# Patient Record
Sex: Female | Born: 1965 | Race: Black or African American | Hispanic: Yes | Marital: Married | Smoking: Never smoker
Health system: Southern US, Community
[De-identification: ages and names within clinical notes are randomized; demographics above are authoritative.]

## PROBLEM LIST (undated history)

## (undated) DIAGNOSIS — F419 Anxiety disorder, unspecified: Secondary | ICD-10-CM

## (undated) HISTORY — PX: LIPOSUCTION: SHX10

---

## 2016-09-19 ENCOUNTER — Encounter (HOSPITAL_BASED_OUTPATIENT_CLINIC_OR_DEPARTMENT_OTHER): Payer: Self-pay | Admitting: Emergency Medicine

## 2016-09-19 ENCOUNTER — Inpatient Hospital Stay (HOSPITAL_BASED_OUTPATIENT_CLINIC_OR_DEPARTMENT_OTHER)
Admission: EM | Admit: 2016-09-19 | Discharge: 2016-09-21 | DRG: 419 | Disposition: A | Discharge status: Home / Self Care | Payer: PRIVATE HEALTH INSURANCE | Attending: Surgery | Admitting: Surgery

## 2016-09-19 ENCOUNTER — Emergency Department (HOSPITAL_BASED_OUTPATIENT_CLINIC_OR_DEPARTMENT_OTHER): Payer: PRIVATE HEALTH INSURANCE

## 2016-09-19 DIAGNOSIS — K8012 Calculus of gallbladder with acute and chronic cholecystitis without obstruction: Principal | ICD-10-CM | POA: Diagnosis present

## 2016-09-19 DIAGNOSIS — L299 Pruritus, unspecified: Secondary | ICD-10-CM | POA: Diagnosis not present

## 2016-09-19 DIAGNOSIS — K760 Fatty (change of) liver, not elsewhere classified: Secondary | ICD-10-CM | POA: Diagnosis present

## 2016-09-19 DIAGNOSIS — R0902 Hypoxemia: Secondary | ICD-10-CM

## 2016-09-19 DIAGNOSIS — M542 Cervicalgia: Secondary | ICD-10-CM | POA: Diagnosis not present

## 2016-09-19 DIAGNOSIS — F419 Anxiety disorder, unspecified: Secondary | ICD-10-CM

## 2016-09-19 DIAGNOSIS — K1120 Sialoadenitis, unspecified: Secondary | ICD-10-CM | POA: Diagnosis not present

## 2016-09-19 DIAGNOSIS — G8929 Other chronic pain: Secondary | ICD-10-CM | POA: Diagnosis present

## 2016-09-19 DIAGNOSIS — K812 Acute cholecystitis with chronic cholecystitis: Secondary | ICD-10-CM | POA: Diagnosis present

## 2016-09-19 DIAGNOSIS — Z419 Encounter for procedure for purposes other than remedying health state, unspecified: Secondary | ICD-10-CM

## 2016-09-19 DIAGNOSIS — M545 Low back pain, unspecified: Secondary | ICD-10-CM | POA: Diagnosis present

## 2016-09-19 DIAGNOSIS — K1121 Acute sialoadenitis: Secondary | ICD-10-CM | POA: Diagnosis present

## 2016-09-19 DIAGNOSIS — Z6838 Body mass index (BMI) 38.0-38.9, adult: Secondary | ICD-10-CM

## 2016-09-19 DIAGNOSIS — R1013 Epigastric pain: Secondary | ICD-10-CM | POA: Diagnosis not present

## 2016-09-19 DIAGNOSIS — K429 Umbilical hernia without obstruction or gangrene: Secondary | ICD-10-CM | POA: Diagnosis present

## 2016-09-19 DIAGNOSIS — E669 Obesity, unspecified: Secondary | ICD-10-CM | POA: Diagnosis present

## 2016-09-19 DIAGNOSIS — E876 Hypokalemia: Secondary | ICD-10-CM | POA: Diagnosis present

## 2016-09-19 DIAGNOSIS — K810 Acute cholecystitis: Secondary | ICD-10-CM

## 2016-09-19 HISTORY — DX: Anxiety disorder, unspecified: F41.9

## 2016-09-19 LAB — URINALYSIS, ROUTINE W REFLEX MICROSCOPIC
Glucose, UA: NEGATIVE mg/dL
Hgb urine dipstick: NEGATIVE
KETONES UR: NEGATIVE mg/dL
LEUKOCYTES UA: NEGATIVE
NITRITE: NEGATIVE
PH: 7 (ref 5.0–8.0)
PROTEIN: NEGATIVE mg/dL
Specific Gravity, Urine: 1.008 (ref 1.005–1.030)

## 2016-09-19 LAB — COMPREHENSIVE METABOLIC PANEL
ALBUMIN: 3.7 g/dL (ref 3.5–5.0)
ALT: 36 U/L (ref 14–54)
ANION GAP: 7 (ref 5–15)
AST: 29 U/L (ref 15–41)
Alkaline Phosphatase: 76 U/L (ref 38–126)
BILIRUBIN TOTAL: 0.4 mg/dL (ref 0.3–1.2)
BUN: 14 mg/dL (ref 6–20)
CO2: 31 mmol/L (ref 22–32)
Calcium: 9.9 mg/dL (ref 8.9–10.3)
Chloride: 99 mmol/L — ABNORMAL LOW (ref 101–111)
Creatinine, Ser: 1.04 mg/dL — ABNORMAL HIGH (ref 0.44–1.00)
GFR calc non Af Amer: >60 mL/min (ref >60)
GLUCOSE: 120 mg/dL — AB (ref 65–99)
POTASSIUM: 2.9 mmol/L — AB (ref 3.5–5.1)
SODIUM: 137 mmol/L (ref 135–145)
TOTAL PROTEIN: 6.8 g/dL (ref 6.5–8.1)

## 2016-09-19 LAB — CBC WITH DIFFERENTIAL/PLATELET
BASOS PCT: 0 %
Basophils Absolute: 0 10*3/uL (ref 0.0–0.1)
EOS ABS: 0.2 10*3/uL (ref 0.0–0.7)
Eosinophils Relative: 3 %
HCT: 35.3 % — ABNORMAL LOW (ref 36.0–46.0)
Hemoglobin: 11.7 g/dL — ABNORMAL LOW (ref 12.0–15.0)
Lymphocytes Relative: 23 %
Lymphs Abs: 1.9 10*3/uL (ref 0.7–4.0)
MCH: 30.5 pg (ref 26.0–34.0)
MCHC: 33.1 g/dL (ref 30.0–36.0)
MCV: 91.9 fL (ref 78.0–100.0)
MONO ABS: 0.4 10*3/uL (ref 0.1–1.0)
MONOS PCT: 5 %
Neutro Abs: 5.6 10*3/uL (ref 1.7–7.7)
Neutrophils Relative %: 69 %
Platelets: 266 10*3/uL (ref 150–400)
RBC: 3.84 MIL/uL — ABNORMAL LOW (ref 3.87–5.11)
RDW: 12.2 % (ref 11.5–15.5)
WBC: 8.1 10*3/uL (ref 4.0–10.5)

## 2016-09-19 LAB — LIPASE, BLOOD: Lipase: 29 U/L (ref 11–51)

## 2016-09-19 LAB — MAGNESIUM: MAGNESIUM: 1.9 mg/dL (ref 1.7–2.4)

## 2016-09-19 MED ORDER — DEXTROSE 5 % IV SOLN
2.0000 g | INTRAVENOUS | Status: DC
  Administered 2016-09-19: 2 g via INTRAVENOUS
  Filled 2016-09-19: qty 2

## 2016-09-19 MED ORDER — LACTATED RINGERS IV BOLUS (SEPSIS): 1000.0000 mL | Freq: Three times a day (TID) | INTRAVENOUS | Status: DC | PRN

## 2016-09-19 MED ORDER — CHLORHEXIDINE GLUCONATE CLOTH 2 % EX PADS
6.0000 | MEDICATED_PAD | Freq: Once | CUTANEOUS | Status: AC
  Administered 2016-09-20: 6 via TOPICAL
  Filled 2016-09-19: qty 6

## 2016-09-19 MED ORDER — POTASSIUM CHLORIDE 10 MEQ/100ML IV SOLN
10.0000 meq | INTRAVENOUS | Status: DC
  Filled 2016-09-19 (×2): qty 100

## 2016-09-19 MED ORDER — METHOCARBAMOL 1000 MG/10ML IJ SOLN
1000.0000 mg | Freq: Four times a day (QID) | INTRAVENOUS | Status: DC | PRN
  Filled 2016-09-19: qty 10

## 2016-09-19 MED ORDER — LIP MEDEX EX OINT
1.0000 "application " | TOPICAL_OINTMENT | Freq: Two times a day (BID) | CUTANEOUS | Status: DC
  Administered 2016-09-20 – 2016-09-21 (×3): 1 via TOPICAL
  Filled 2016-09-19 (×2): qty 7

## 2016-09-19 MED ORDER — SACCHAROMYCES BOULARDII 250 MG PO CAPS
250.0000 mg | ORAL_CAPSULE | Freq: Two times a day (BID) | ORAL | Status: DC
  Administered 2016-09-20 – 2016-09-21 (×2): 250 mg via ORAL
  Filled 2016-09-19 (×2): qty 1

## 2016-09-19 MED ORDER — ONDANSETRON HCL 4 MG/2ML IJ SOLN
4.0000 mg | Freq: Four times a day (QID) | INTRAMUSCULAR | Status: DC | PRN
  Administered 2016-09-19: 4 mg via INTRAVENOUS
  Filled 2016-09-19: qty 2

## 2016-09-19 MED ORDER — POLYETHYLENE GLYCOL 3350 17 G PO PACK
17.0000 g | PACK | Freq: Two times a day (BID) | ORAL | Status: DC
  Administered 2016-09-20 – 2016-09-21 (×2): 17 g via ORAL
  Filled 2016-09-19 (×2): qty 1

## 2016-09-19 MED ORDER — CHLORHEXIDINE GLUCONATE CLOTH 2 % EX PADS
6.0000 | MEDICATED_PAD | Freq: Once | CUTANEOUS | Status: AC
  Administered 2016-09-19: 6 via TOPICAL
  Filled 2016-09-19: qty 6

## 2016-09-19 MED ORDER — CELECOXIB 200 MG PO CAPS
400.0000 mg | ORAL_CAPSULE | ORAL | Status: AC
  Administered 2016-09-20: 400 mg via ORAL
  Filled 2016-09-19: qty 1
  Filled 2016-09-19: qty 2

## 2016-09-19 MED ORDER — ACETAMINOPHEN 325 MG PO TABS: 325.0000 mg | ORAL_TABLET | Freq: Four times a day (QID) | ORAL | Status: DC | PRN

## 2016-09-19 MED ORDER — MENTHOL 3 MG MT LOZG
1.0000 | LOZENGE | OROMUCOSAL | Status: DC | PRN
  Filled 2016-09-19: qty 9

## 2016-09-19 MED ORDER — GABAPENTIN 300 MG PO CAPS
300.0000 mg | ORAL_CAPSULE | ORAL | Status: AC
  Administered 2016-09-20: 300 mg via ORAL
  Filled 2016-09-19: qty 1

## 2016-09-19 MED ORDER — PHENOL 1.4 % MT LIQD
2.0000 | OROMUCOSAL | Status: DC | PRN
  Filled 2016-09-19: qty 177

## 2016-09-19 MED ORDER — PROCHLORPERAZINE EDISYLATE 5 MG/ML IJ SOLN: 5.0000 mg | INTRAMUSCULAR | Status: DC | PRN

## 2016-09-19 MED ORDER — MAGIC MOUTHWASH
15.0000 mL | Freq: Four times a day (QID) | ORAL | Status: DC | PRN
  Filled 2016-09-19: qty 15

## 2016-09-19 MED ORDER — POTASSIUM CHLORIDE CRYS ER 20 MEQ PO TBCR
40.0000 meq | EXTENDED_RELEASE_TABLET | Freq: Once | ORAL | Status: AC
  Administered 2016-09-19: 40 meq via ORAL
  Filled 2016-09-19: qty 2

## 2016-09-19 MED ORDER — ACETAMINOPHEN 500 MG PO TABS
1000.0000 mg | ORAL_TABLET | ORAL | Status: AC
  Administered 2016-09-20: 1000 mg via ORAL

## 2016-09-19 MED ORDER — HYDROMORPHONE HCL 1 MG/ML IJ SOLN
0.5000 mg | INTRAMUSCULAR | Status: DC | PRN
  Administered 2016-09-19 – 2016-09-20 (×3): 1 mg via INTRAVENOUS
  Filled 2016-09-19 (×2): qty 1

## 2016-09-19 MED ORDER — POTASSIUM CHLORIDE 10 MEQ/100ML IV SOLN
10.0000 meq | INTRAVENOUS | Status: AC
  Administered 2016-09-19: 10 meq via INTRAVENOUS
  Filled 2016-09-19: qty 100

## 2016-09-19 MED ORDER — SODIUM CHLORIDE 0.9 % IV SOLN
30.0000 meq | Freq: Once | INTRAVENOUS | Status: AC
  Administered 2016-09-20: 30 meq via INTRAVENOUS
  Filled 2016-09-19: qty 15

## 2016-09-19 MED ORDER — LACTATED RINGERS IV BOLUS (SEPSIS)
1000.0000 mL | Freq: Once | INTRAVENOUS | Status: AC
  Administered 2016-09-20: 1000 mL via INTRAVENOUS

## 2016-09-19 MED ORDER — DIPHENHYDRAMINE HCL 50 MG/ML IJ SOLN: 12.5000 mg | Freq: Four times a day (QID) | INTRAMUSCULAR | Status: DC | PRN

## 2016-09-19 MED ORDER — BISACODYL 10 MG RE SUPP: 10.0000 mg | Freq: Two times a day (BID) | RECTAL | Status: DC | PRN

## 2016-09-19 MED ORDER — METHOCARBAMOL 500 MG PO TABS: 1000.0000 mg | ORAL_TABLET | Freq: Four times a day (QID) | ORAL | Status: DC | PRN

## 2016-09-19 MED ORDER — METOPROLOL TARTRATE 5 MG/5ML IV SOLN: 5.0000 mg | Freq: Four times a day (QID) | INTRAVENOUS | Status: DC | PRN

## 2016-09-19 MED ORDER — LACTATED RINGERS IV BOLUS (SEPSIS)
1000.0000 mL | Freq: Once | INTRAVENOUS | Status: AC
  Administered 2016-09-19: 1000 mL via INTRAVENOUS

## 2016-09-19 MED ORDER — METOCLOPRAMIDE HCL 5 MG/ML IJ SOLN: 5.0000 mg | Freq: Four times a day (QID) | INTRAMUSCULAR | Status: DC | PRN

## 2016-09-19 MED ORDER — OXYCODONE HCL 5 MG PO TABS
5.0000 mg | ORAL_TABLET | ORAL | Status: DC | PRN
  Administered 2016-09-20 (×2): 5 mg via ORAL
  Administered 2016-09-21: 10 mg via ORAL
  Filled 2016-09-19: qty 2
  Filled 2016-09-19 (×2): qty 1

## 2016-09-19 MED ORDER — SODIUM CHLORIDE 0.9 % IV SOLN
8.0000 mg | Freq: Four times a day (QID) | INTRAVENOUS | Status: DC | PRN
  Filled 2016-09-19: qty 4

## 2016-09-19 MED ORDER — ALUM & MAG HYDROXIDE-SIMETH 200-200-20 MG/5ML PO SUSP: 30.0000 mL | Freq: Four times a day (QID) | ORAL | Status: DC | PRN

## 2016-09-19 MED ORDER — IBUPROFEN 400 MG PO TABS
400.0000 mg | ORAL_TABLET | Freq: Four times a day (QID) | ORAL | Status: DC | PRN
  Filled 2016-09-19: qty 2

## 2016-09-19 MED ORDER — TRAMADOL HCL 50 MG PO TABS: 50.0000 mg | ORAL_TABLET | Freq: Four times a day (QID) | ORAL | Status: DC | PRN

## 2016-09-19 MED ORDER — LACTATED RINGERS IV SOLN
INTRAVENOUS | Status: DC
Start: 2016-09-19 — End: 2016-09-21
  Administered 2016-09-20: 10:00:00 via INTRAVENOUS
  Administered 2016-09-20: 1000 mL via INTRAVENOUS

## 2016-09-19 MED ORDER — ACETAMINOPHEN 650 MG RE SUPP: 650.0000 mg | Freq: Four times a day (QID) | RECTAL | Status: DC | PRN

## 2016-09-19 NOTE — ED Provider Notes (Signed)
[facility] Provider Note   CSN: [id number] Arrival date & time: 09/19/16  1738  By signing my name below, I, Sonum Patel, attest that this documentation has been prepared under the direction and in the presence of [employer]. Electronically Signed: Sonum Patel, Neurosurgeon. 09/19/16. 6:12 PM.  History   Chief Complaint Chief Complaint  Patient presents with  . Abdominal Pain    The history is provided by the patient. No language interpreter was used.     HPI Comments: Rebecca Tyler is a 50 y.o. female who presents to the Emergency Department complaining of constant, unchanged abdominal pain that began yesterday. She describes it as sharp to the periumbilical area and as throbbing/cramping to the epigastric region. She states her abdominal pain is worse with having a BM. She states flatulence does not ease her pain. She has tried Tums, Mylanta, Gas-X without relief. She states the pain is unchanged with eating or drinking. She regularly takes anti-inflammatories (Etodolac) but states they have not caused her any abdominal issues in the past. She denies alcohol use. She reports history of liposuction to the abdomen about 3 years ago; denies history of hernias, appendectomy, or cholecystectomy. She denies diarrhea, constipation, hematuria, blood in stool.   Past Medical History:  Diagnosis Date  . Anxiety     There are no active problems to display for this patient.   History reviewed. No pertinent surgical history.  OB History    No data available       Home Medications    Prior to Admission medications   Medication Sig Start Date End Date Taking? Authorizing Provider  atenolol-chlorthalidone (TENORETIC) 50-25 MG tablet Take 1 tablet by mouth daily.   Yes Historical Provider, MD  buPROPion (WELLBUTRIN SR) 150 MG 12 hr tablet Take 150 mg by mouth 2 (two) times daily.   Yes Historical Provider, MD  buPROPion (WELLBUTRIN XL) 300 MG 24 hr tablet Take 300 mg by mouth  daily.   Yes Historical Provider, MD  citalopram (CELEXA) 20 MG tablet Take 40 mg by mouth daily.   Yes Historical Provider, MD  etodolac (LODINE) 500 MG tablet Take 500 mg by mouth 2 (two) times daily.   Yes Historical Provider, MD    Family History History reviewed. No pertinent family history.  Social History Social History  Substance Use Topics  . Smoking status: Never Smoker  . Smokeless tobacco: Never Used  . Alcohol use No     Allergies   Sulfa antibiotics   Review of Systems Review of Systems  Constitutional: Negative.  Negative for fever.  HENT: Negative.  Negative for rhinorrhea and sore throat.   Eyes: Negative for redness.  Respiratory: Negative.  Negative for cough.   Cardiovascular: Negative.  Negative for chest pain.  Gastrointestinal: Positive for abdominal pain. Negative for constipation, diarrhea, nausea and vomiting.  Genitourinary: Negative.  Negative for dysuria and hematuria.  Musculoskeletal: Negative.  Negative for myalgias.  Skin: Negative.  Negative for rash.  Neurological: Negative.  Negative for headaches.  Psychiatric/Behavioral: Negative.      Physical Exam Updated Vital Signs BP 124/75 (BP Location: Right Arm)   Pulse 75   Temp 98.4 F (36.9 C) (Oral)   Resp 20   Ht 5\' 6"  (1.676 m)   Wt 240 lb (108.9 kg)   SpO2 100%   BMI 38.74 kg/m   Physical Exam  Constitutional: She is oriented to person, place, and time. She appears well-developed and well-nourished.  HENT:  Head:  Normocephalic and atraumatic.  Eyes: Conjunctivae are normal. Right eye exhibits no discharge. Left eye exhibits no discharge.  Neck: Normal range of motion. Neck supple.  Cardiovascular: Normal rate, regular rhythm and normal heart sounds.   Pulmonary/Chest: Effort normal and breath sounds normal.  Abdominal: Soft. Bowel sounds are normal. She exhibits no distension and no mass. There is tenderness. There is guarding.    Neurological: She is alert and oriented  to person, place, and time.  Skin: Skin is warm and dry.  Psychiatric: She has a normal mood and affect.  Nursing note and vitals reviewed.  ED Treatments / Results  DIAGNOSTIC STUDIES: Oxygen Saturation is 100% on RA, normal by my interpretation.    COORDINATION OF CARE: 6:12 PM Will order lab work and US. Discussed treatment plan with pt at bedside and pt agreed to plan.    Labs (all labs ordered are listed, but only abnormal results are displayed) Labs Reviewed  CBC WITH DIFFERENTIAL/PLATELET - Abnormal; Notable for the following:       Result Value   RBC 3.84 (*)    Hemoglobin 11.7 (*)    HCT 35.3 (*)    All other components within normal limits  COMPREHENSIVE METABOLIC PANEL - Abnormal; Notable for the following:    Potassium 2.9 (*)    Chloride 99 (*)    Glucose, Bld 120 (*)    Creatinine, Ser 1.04 (*)    All other components within normal limits  URINALYSIS, ROUTINE W REFLEX MICROSCOPIC (NOT AT [facility]) - Abnormal; Notable for the following:    Bilirubin Urine MODERATE (*)    All other components within normal limits  LIPASE, BLOOD  MAGNESIUM     Radiology [place] Abdomen Complete  Result Date: 09/19/2016 CLINICAL DATA:  Periumbilical and epigastric pain for 1 day. EXAM: ABDOMEN ULTRASOUND COMPLETE COMPARISON:  None. FINDINGS: Gallbladder: Cholelithiasis with over distended gallbladder that is focally tender, thick walled (5 mm), and shows pericholecystic edema. Common bile duct: Diameter: 3 mm Liver: Diffusely echogenic. No evidence of mass. Antegrade flow in the imaged hepatic and portal venous system. IVC: No abnormality visualized. Pancreas: Visualized portion unremarkable. Spleen: Size and appearance within normal limits. Right Kidney: Length: 11.5 cm. Echogenicity within normal limits. No mass or hydronephrosis visualized. Left Kidney: Length: 11 cm. Echogenicity within normal limits. No mass or hydronephrosis visualized. Abdominal aorta: No aneurysm visualized.  IMPRESSION: 1. Positive for cholelithiasis and signs of acute cholecystitis. 2. Hepatic steatosis. Electronically Signed   By: Marnee SpringJonathon  Watts M.D.   On: 09/19/2016 19:36    Procedures Procedures (including critical care time)  Medications Ordered in ED Medications  Chlorhexidine Gluconate Cloth 2 % PADS 6 each (not administered)    And  Chlorhexidine Gluconate Cloth 2 % PADS 6 each (not administered)  gabapentin (NEURONTIN) capsule 300 mg (not administered)  acetaminophen (TYLENOL) tablet 1,000 mg (not administered)  celecoxib (CELEBREX) capsule 400 mg (not administered)  lactated ringers infusion (not administered)  lactated ringers bolus 1,000 mL (not administered)  lactated ringers bolus 1,000 mL (not administered)  cefTRIAXone (ROCEPHIN) 2 g in dextrose 5 % 50 mL IVPB (not administered)  HYDROmorphone (DILAUDID) injection 0.5-2 mg (not administered)  traMADol (ULTRAM) tablet 50-100 mg (not administered)  ibuprofen (ADVIL,MOTRIN) tablet 400-800 mg (not administered)  methocarbamol (ROBAXIN) 1,000 mg in dextrose 5 % 50 mL IVPB (not administered)  methocarbamol (ROBAXIN) tablet 1,000 mg (not administered)  lip balm (CARMEX) ointment 1 application (not administered)  phenol (CHLORASEPTIC) mouth spray 2 spray (not administered)  menthol-cetylpyridinium (CEPACOL) lozenge 3 mg (not administered)  magic mouthwash (not administered)  alum & mag hydroxide-simeth (MAALOX/MYLANTA) 200-200-20 MG/5ML suspension 30 mL (not administered)  bisacodyl (DULCOLAX) suppository 10 mg (not administered)  ondansetron (ZOFRAN) injection 4 mg (not administered)    Or  ondansetron (ZOFRAN) 8 mg in sodium chloride 0.9 % 50 mL IVPB (not administered)  prochlorperazine (COMPAZINE) injection 5-10 mg (not administered)  metoCLOPramide (REGLAN) injection 5-10 mg (not administered)  metoprolol (LOPRESSOR) injection 5 mg (not administered)  diphenhydrAMINE (BENADRYL) injection 12.5-25 mg (not administered)    potassium chloride SA (K-DUR,KLOR-CON) CR tablet 40 mEq (40 mEq Oral Given 09/19/16 1923)     Initial Impression / Assessment and Plan / ED Course  I have reviewed the triage vital signs and the nursing notes.  Pertinent labs & imaging results that were available during my care of the patient were reviewed by me and considered in my medical decision making (see chart for details).  Clinical Course    7:49 PM Rechecked patient at this time and updated on imaging results.   8:49 PM examination imaging is concerning for early acute cholecystitis. I spoke with Dr. Michaell CowingGross. He requests that [facility]. I discussed this with the patient. Ideally, she would return to [place] for treatment however after discussion of risks and benefits, probability of worsening, she agrees to be admitted here.  [facility], Spoke with Earley FavorGail Schulz NP who is working with Dr. Madilyn Hookees. They will expect patient and page Dr. Michaell CowingGross on arrival.   Potassium repletion underway.   Final Clinical Impressions(s) / ED Diagnoses   Final diagnoses:  Acute cholecystitis  Hypokalemia   To [facility] for eval, possible acute cholecystitis.   New Prescriptions New Prescriptions   No medications on file   I personally performed the services described in this documentation, which was scribed in my presence. The recorded information has been reviewed and is accurate.    Renne CriglerJoshua Opel Lejeune, PA-C 09/19/16 2051    Laurence Spatesachel Morgan Little, MD 09/24/16 [phone]

## 2016-09-19 NOTE — H&P (Signed)
[facility]  [address] [place], [place] [address] Phone: [phone] FAX: [phone]     Rebecca Tyler  02-16-1966 [id number]  CARE TEAM:  PCP: Pcp Not In System  Outpatient Care Team: Patient Care Team: Pcp Not In System as PCP - General  Inpatient Treatment Team: Treatment Team: Attending Provider: Sharlett Iles, MD; Physician Assistant: Carlisle Cater, PA-C; Registered Nurse: Radene Journey, RN; Consulting Physician: Nolon Nations, MD  This patient is a 50 y.o.female who presents today for surgical evaluation at the request of Dr Linton Ham Geiple, [facility].   Reason for evaluation: Acute cholecystitis  Pleasant obese female traveling for the holidays.  We will come up yesterday with abdominal pain.  Mainly periumbilical and upper abdomen.  Pain to take deep breath.  Took her chronic pain NSAID medication extra that she usually takes for her chronic low back pain.  Seemed to help.  Pain came back though.  No improvement with over-the-counter and acid in anti-gas medications.  Intensified.  Worsened.  Nauseated.  Poor appetite.  Came to the emergency room.  No frank peritonitis but pain in upper abdomen suspicious.  Ultrasound done showing gallbladder wall thickening and pericholecystic fluid with gallstones.  Very suspicious for acute cholecystitis.  [place] was [place] [facility] [place].  Given evidence of cholecystitis and needing IV pain control, recommendation made for surgical evaluation.  Patient transferred to was in the hospital so that could happen.  Husband at bedside.  No personal nor family history of GI/colon cancer, inflammatory bowel disease, irritable bowel syndrome, allergy such as Celiac Sprue, dietary/dairy problems, colitis, ulcers nor gastritis.  No recent sick contacts/gastroenteritis.  No travel outside the country.  No changes in diet.  No dysphagia to solids or liquids.  No significant  heartburn or reflux.  No hematochezia, hematemesis, coffee ground emesis.  No evidence of prior gastric/peptic ulceration.  Normally moves her bowels every day.  No severe constipation or diarrhea.  No is been sick while traveling.  No radical change in diet.   Past Medical History:  Diagnosis Date  . Anxiety     History reviewed. No pertinent surgical history.  Social History   Social History  . Marital status: Married    Spouse name: N/A  . Number of children: N/A  . Years of education: N/A   Occupational History  . Not on file.   Social History Main Topics  . Smoking status: Never Smoker  . Smokeless tobacco: Never Used  . Alcohol use No  . Drug use: No  . Sexual activity: Not on file   Other Topics Concern  . Not on file   Social History Narrative  . No narrative on file    History reviewed. No pertinent family history.  Current Facility-Administered Medications  Medication Dose Route Frequency Provider Last Rate Last Dose  . [START ON 09/20/2016] acetaminophen (TYLENOL) tablet 1,000 mg  1,000 mg Oral On Call to OR Michael Boston, MD      . alum & mag hydroxide-simeth (MAALOX/MYLANTA) 200-200-20 MG/5ML suspension 30 mL  30 mL Oral Q6H PRN Michael Boston, MD      . bisacodyl (DULCOLAX) suppository 10 mg  10 mg Rectal Q12H PRN Michael Boston, MD      . cefTRIAXone (ROCEPHIN) 2 g in dextrose 5 % 50 mL IVPB  2 g Intravenous Q24H Michael Boston, MD      . Derrill Memo ON 09/20/2016] celecoxib (CELEBREX)  capsule 400 mg  400 mg Oral On Call to OR Michael Boston, MD      . Chlorhexidine Gluconate Cloth 2 % PADS 6 each  6 each Topical Once Michael Boston, MD       And  . Chlorhexidine Gluconate Cloth 2 % PADS 6 each  6 each Topical Once Michael Boston, MD      . diphenhydrAMINE (BENADRYL) injection 12.5-25 mg  12.5-25 mg Intravenous Q6H PRN Michael Boston, MD      . Derrill Memo ON 09/20/2016] gabapentin (NEURONTIN) capsule 300 mg  300 mg Oral On Call to OR Michael Boston, MD      . HYDROmorphone  (DILAUDID) injection 0.5-2 mg  0.5-2 mg Intravenous Q1H PRN Michael Boston, MD      . ibuprofen (ADVIL,MOTRIN) tablet 400-800 mg  400-800 mg Oral Q6H PRN Michael Boston, MD      . lactated ringers bolus 1,000 mL  1,000 mL Intravenous Once Michael Boston, MD      . lactated ringers bolus 1,000 mL  1,000 mL Intravenous Q8H PRN Michael Boston, MD      . lactated ringers infusion   Intravenous Continuous Michael Boston, MD      . lip balm (CARMEX) ointment 1 application  1 application Topical BID Michael Boston, MD      . magic mouthwash  15 mL Oral QID PRN Michael Boston, MD      . menthol-cetylpyridinium (CEPACOL) lozenge 3 mg  1 lozenge Oral PRN Michael Boston, MD      . methocarbamol (ROBAXIN) 1,000 mg in dextrose 5 % 50 mL IVPB  1,000 mg Intravenous Q6H PRN Michael Boston, MD      . methocarbamol (ROBAXIN) tablet 1,000 mg  1,000 mg Oral Q6H PRN Michael Boston, MD      . metoCLOPramide (REGLAN) injection 5-10 mg  5-10 mg Intravenous Q6H PRN Michael Boston, MD      . metoprolol (LOPRESSOR) injection 5 mg  5 mg Intravenous Q6H PRN Michael Boston, MD      . ondansetron [facility]) injection 4 mg  4 mg Intravenous Q6H PRN Michael Boston, MD       Or  . ondansetron (ZOFRAN) 8 mg in sodium chloride 0.9 % 50 mL IVPB  8 mg Intravenous Q6H PRN Michael Boston, MD      . phenol (CHLORASEPTIC) mouth spray 2 spray  2 spray Mouth/Throat PRN Michael Boston, MD      . prochlorperazine (COMPAZINE) injection 5-10 mg  5-10 mg Intravenous Q4H PRN Michael Boston, MD      . traMADol Veatrice Bourbon) tablet 50-100 mg  50-100 mg Oral Q6H PRN Michael Boston, MD       Current Outpatient Prescriptions  Medication Sig Dispense Refill  . atenolol-chlorthalidone (TENORETIC) 50-25 MG tablet Take 1 tablet by mouth daily.    Marland Kitchen buPROPion (WELLBUTRIN SR) 150 MG 12 hr tablet Take 150 mg by mouth 2 (two) times daily.    Marland Kitchen buPROPion (WELLBUTRIN XL) 300 MG 24 hr tablet Take 300 mg by mouth daily.    . citalopram (CELEXA) 20 MG tablet Take 40 mg by mouth daily.    Marland Kitchen etodolac  (LODINE) 500 MG tablet Take 500 mg by mouth 2 (two) times daily.       Allergies  Allergen Reactions  . Sulfa Antibiotics Rash    ROS: Constitutional:  No fevers, chills, sweats.  Weight stable Eyes:  No vision changes, No discharge HENT:  No sore throats, nasal drainage Lymph: No neck swelling,  No bruising easily Pulmonary:  No cough, productive sputum CV: No orthopnea, PND  Patient walks 30 minutes for about 1 miles without difficulty.  No exertional chest/neck/shoulder/arm pain. GI: No personal nor family history of GI/colon cancer, inflammatory bowel disease, irritable bowel syndrome, allergy such as Celiac Sprue, dietary/dairy problems, colitis, ulcers nor gastritis.  No recent sick contacts/gastroenteritis.  No travel outside the country.  No changes in diet. Renal: No UTIs, No hematuria Genital:  No drainage, bleeding, masses Musculoskeletal: Chronic LBP.  No severe joint pain.  Good ROM major joints Skin:  No sores or lesions.  No rashes Heme/Lymph:  No easy bleeding.  No swollen lymph nodes Neuro: No focal weakness/numbness.  No seizures Psych: No suicidal ideation.  No hallucinations  BP 124/75 (BP Location: Right Arm)   Pulse 75   Temp 98.4 F (36.9 C) (Oral)   Resp 20   Ht 5' 6"  (1.676 m)   Wt 108.9 kg (240 lb)   SpO2 100%   BMI 38.74 kg/m   Physical Exam: General: Pt awake/alert/oriented x4 in mild major acute distress Eyes: PERRL, normal EOM. Sclera nonicteric Neuro: CN II-XII intact w/o focal sensory/motor deficits. Lymph: No head/neck/groin lymphadenopathy Psych:  No delerium/psychosis/paranoia HENT: Normocephalic, Mucus membranes moist.  No thrush Neck: Supple, No tracheal deviation Chest: No pain.  Good respiratory excursion. CV:  Pulses intact.  Regular rhythm Abdomen: Soft, Obese.  Tenderness in right upper quadrant.  Epigastric region as well.  Umbilicus very sensitive for some referred pain there.  No pain in left side or in suprapubic region.   Nondistended.  No diastases.  No umbilical hernia  Genital: Normal external genitalia.  Known hernia.  No lymphadenopathy. Ext:  SCDs BLE.  No significant edema.  No cyanosis Skin: No petechiae / purpurea.  No major sores Musculoskeletal: No severe joint pain.  Good ROM major joints   Results:   Labs: Results for orders placed or performed during the hospital encounter of 09/19/16 (from the past 48 hour(s))  CBC with Differential/Platelet     Status: Abnormal   Collection Time: 09/19/16  6:23 PM  Result Value Ref Range   WBC 8.1 4.0 - 10.5 K/uL   RBC 3.84 (L) 3.87 - 5.11 MIL/uL   Hemoglobin 11.7 (L) 12.0 - 15.0 g/dL   HCT 35.3 (L) 36.0 - 46.0 %   MCV 91.9 78.0 - 100.0 fL   MCH 30.5 26.0 - 34.0 pg   MCHC 33.1 30.0 - 36.0 g/dL   RDW 12.2 11.5 - 15.5 %   Platelets 266 150 - 400 K/uL   Neutrophils Relative % 69 %   Neutro Abs 5.6 1.7 - 7.7 K/uL   Lymphocytes Relative 23 %   Lymphs Abs 1.9 0.7 - 4.0 K/uL   Monocytes Relative 5 %   Monocytes Absolute 0.4 0.1 - 1.0 K/uL   Eosinophils Relative 3 %   Eosinophils Absolute 0.2 0.0 - 0.7 K/uL   Basophils Relative 0 %   Basophils Absolute 0.0 0.0 - 0.1 K/uL  Comprehensive metabolic panel     Status: Abnormal   Collection Time: 09/19/16  6:23 PM  Result Value Ref Range   Sodium 137 135 - 145 mmol/L   Potassium 2.9 (L) 3.5 - 5.1 mmol/L   Chloride 99 (L) 101 - 111 mmol/L   CO2 31 22 - 32 mmol/L   Glucose, Bld 120 (H) 65 - 99 mg/dL   BUN 14 6 - 20 mg/dL   Creatinine, Ser 1.04 (H) 0.44 -  1.00 mg/dL   Calcium 9.9 8.9 - 10.3 mg/dL   Total Protein 6.8 6.5 - 8.1 g/dL   Albumin 3.7 3.5 - 5.0 g/dL   AST 29 15 - 41 U/L   ALT 36 14 - 54 U/L   Alkaline Phosphatase 76 38 - 126 U/L   Total Bilirubin 0.4 0.3 - 1.2 mg/dL   GFR calc non Af Amer >60 >60 mL/min   GFR calc Af Amer >60 >60 mL/min    Comment: (NOTE) The eGFR has been calculated using the CKD EPI equation. This calculation has not been validated in all clinical situations. eGFR's  persistently <60 mL/min signify possible Chronic Kidney Disease.    Anion gap 7 5 - 15  Lipase, blood     Status: None   Collection Time: 09/19/16  6:23 PM  Result Value Ref Range   Lipase 29 11 - 51 U/L  Urinalysis, Routine w reflex microscopic (not at [facility])     Status: Abnormal   Collection Time: 09/19/16  6:23 PM  Result Value Ref Range   Color, Urine YELLOW YELLOW   APPearance CLEAR CLEAR   Specific Gravity, Urine 1.008 1.005 - 1.030   pH 7.0 5.0 - 8.0   Glucose, UA NEGATIVE NEGATIVE mg/dL   Hgb urine dipstick NEGATIVE NEGATIVE   Bilirubin Urine MODERATE (A) NEGATIVE   Ketones, ur NEGATIVE NEGATIVE mg/dL   Protein, ur NEGATIVE NEGATIVE mg/dL   Nitrite NEGATIVE NEGATIVE   Leukocytes, UA NEGATIVE NEGATIVE    Comment: MICROSCOPIC NOT DONE ON URINES WITH NEGATIVE PROTEIN, BLOOD, LEUKOCYTES, NITRITE, OR GLUCOSE <1000 mg/dL.    Imaging / Studies: US Abdomen Complete  Result Date: 09/19/2016 CLINICAL DATA:  Periumbilical and epigastric pain for 1 day. EXAM: ABDOMEN ULTRASOUND COMPLETE COMPARISON:  None. FINDINGS: Gallbladder: Cholelithiasis with over distended gallbladder that is focally tender, thick walled (5 mm), and shows pericholecystic edema. Common bile duct: Diameter: 3 mm Liver: Diffusely echogenic. No evidence of mass. Antegrade flow in the imaged hepatic and portal venous system. IVC: No abnormality visualized. Pancreas: Visualized portion unremarkable. Spleen: Size and appearance within normal limits. Right Kidney: Length: 11.5 cm. Echogenicity within normal limits. No mass or hydronephrosis visualized. Left Kidney: Length: 11 cm. Echogenicity within normal limits. No mass or hydronephrosis visualized. Abdominal aorta: No aneurysm visualized. IMPRESSION: 1. Positive for cholelithiasis and signs of acute cholecystitis. 2. Hepatic steatosis. Electronically Signed   By: Monte Fantasia M.D.   On: 09/19/2016 19:36    Medications / Allergies: per  chart  Antibiotics: Anti-infectives    Start     Dose/Rate Route Frequency Ordered Stop   09/19/16 2030  cefTRIAXone (ROCEPHIN) 2 g in dextrose 5 % 50 mL IVPB    Comments:  Pharmacy may adjust dosing strength / duration / interval for maximal efficacy   2 g 100 mL/hr over 30 Minutes Intravenous Every 24 hours 09/19/16 2023        Assessment  Rebecca Tyler  50 y.o. female       Problem List:  Principal Problem:   Acute cholecystitis with chronic cholecystitis   Upper abdominal pain with pain with deep breath and ultrasound and exam suspicious for acute cholecystitis  Rest of differential diagnosis seems unlikely.  Plan:  Admit.  IV fluids.  IV antibiotics.  Pain control.  Nausea control.  Laparoscopiccholecystectomy this admission:  The anatomy & physiology of hepatobiliary & pancreatic function was discussed.  The pathophysiology of gallbladder dysfunction was discussed.  Natural history risks without surgery was discussed.  I feel the risks of no intervention will lead to serious problems that outweigh the operative risks; therefore, I recommended cholecystectomy to remove the pathology.  I explained laparoscopic techniques with possible need for an open approach.  Probable cholangiogram to evaluate the bilary tract was explained as well.    Risks such as bleeding, infection, abscess, leak, injury to other organs, need for repair of tissues / organs, need for further treatment, stroke, heart attack, death, and other risks were discussed.  I noted a good likelihood this will help address the problem.  Possibility that this will not correct all abdominal symptoms was explained.  Goals of post-operative recovery were discussed as well.  We will work to minimize complications.  An educational handout further explaining the pathology and treatment options was given as well.  Questions were answered.  The patient expresses understanding & wishes to proceed with  surgery.   -Hypokalemia.  Potassium given in emergency department.  Repeat in the morning.  Magnesium okay. -VTE prophylaxis- SCDs, etc -mobilize as tolerated to help recovery    Adin Hector, M.D., F.A.C.S. Gastrointestinal and Minimally Invasive Surgery [facility] [address], [place], [place] [address] [phone] Main / Paging   09/19/2016  Note: Portions of this report may have been transcribed using voice recognition software. Every effort was made to ensure accuracy; however, inadvertent computerized transcription errors may be present.   Any transcriptional errors that result from this process are unintentional.

## 2016-09-19 NOTE — ED Notes (Signed)
Pt arrived via [facility] alert and oriented x4. No acute distress. VSS.

## 2016-09-19 NOTE — ED Triage Notes (Signed)
Patient states that she started to have sharp shooting pain to her mid umbilical area. The patient reports that when she passes gas it feels like it will ease it off. The patient reports that she is breathing shallow because when she takes a deep breath it hurts her worse. Patient reports that she has eaten drank today. Reports some OTC treatments

## 2016-09-19 NOTE — ED Notes (Signed)
Declines any med for pain

## 2016-09-19 NOTE — ED Provider Notes (Signed)
50 year old female transferred from [facility] because of abdominal pain and ultrasound findings suggestive of acute cholecystitis. On exam, she does have right upper quadrant tenderness but also has marked periumbilical tenderness. Review of ultrasound report shows thickened gallbladder wall, overly distended gallbladder, and pericholecystic fluid consistent with acute cholecystitis. Review of laboratory workup shows hypokalemia but normal liver function studies and normal WBC and differential. She has received ceftriaxone, IV potassium and oral potassium. Your be given another dose of IV potassium. General surgery is consulted.   Dione Boozeavid Detavious Rinn, MD 09/20/16 [phone]

## 2016-09-19 NOTE — ED Notes (Signed)
Delay in EKG due to provider still in the room.

## 2016-09-19 NOTE — ED Notes (Signed)
Returned from U/S

## 2016-09-19 NOTE — ED Notes (Signed)
Patient transported to Ultrasound 

## 2016-09-20 ENCOUNTER — Encounter (HOSPITAL_COMMUNITY): Payer: Self-pay | Admitting: Surgery

## 2016-09-20 ENCOUNTER — Inpatient Hospital Stay (HOSPITAL_COMMUNITY): Payer: PRIVATE HEALTH INSURANCE

## 2016-09-20 ENCOUNTER — Inpatient Hospital Stay (HOSPITAL_COMMUNITY): Payer: PRIVATE HEALTH INSURANCE | Admitting: Certified Registered Nurse Anesthetist

## 2016-09-20 ENCOUNTER — Encounter (HOSPITAL_COMMUNITY): Admission: EM | Disposition: A | Discharge status: Home / Self Care | Payer: Self-pay | Source: Home / Self Care

## 2016-09-20 DIAGNOSIS — K760 Fatty (change of) liver, not elsewhere classified: Secondary | ICD-10-CM | POA: Diagnosis present

## 2016-09-20 DIAGNOSIS — K429 Umbilical hernia without obstruction or gangrene: Secondary | ICD-10-CM | POA: Diagnosis present

## 2016-09-20 DIAGNOSIS — K8012 Calculus of gallbladder with acute and chronic cholecystitis without obstruction: Secondary | ICD-10-CM | POA: Diagnosis present

## 2016-09-20 DIAGNOSIS — G8929 Other chronic pain: Secondary | ICD-10-CM | POA: Diagnosis present

## 2016-09-20 DIAGNOSIS — E669 Obesity, unspecified: Secondary | ICD-10-CM | POA: Diagnosis present

## 2016-09-20 DIAGNOSIS — R1013 Epigastric pain: Secondary | ICD-10-CM | POA: Diagnosis present

## 2016-09-20 DIAGNOSIS — L299 Pruritus, unspecified: Secondary | ICD-10-CM | POA: Diagnosis not present

## 2016-09-20 DIAGNOSIS — Z6838 Body mass index (BMI) 38.0-38.9, adult: Secondary | ICD-10-CM | POA: Diagnosis not present

## 2016-09-20 DIAGNOSIS — K1121 Acute sialoadenitis: Secondary | ICD-10-CM | POA: Diagnosis present

## 2016-09-20 DIAGNOSIS — M545 Low back pain: Secondary | ICD-10-CM

## 2016-09-20 DIAGNOSIS — M542 Cervicalgia: Secondary | ICD-10-CM | POA: Diagnosis not present

## 2016-09-20 DIAGNOSIS — K1120 Sialoadenitis, unspecified: Secondary | ICD-10-CM | POA: Diagnosis present

## 2016-09-20 DIAGNOSIS — E876 Hypokalemia: Secondary | ICD-10-CM | POA: Diagnosis present

## 2016-09-20 HISTORY — PX: CHOLECYSTECTOMY: SHX55

## 2016-09-20 LAB — BASIC METABOLIC PANEL
ANION GAP: 5 (ref 5–15)
BUN: 10 mg/dL (ref 6–20)
CHLORIDE: 102 mmol/L (ref 101–111)
CO2: 29 mmol/L (ref 22–32)
Calcium: 8.9 mg/dL (ref 8.9–10.3)
Creatinine, Ser: 0.86 mg/dL (ref 0.44–1.00)
GFR calc non Af Amer: >60 mL/min (ref >60)
GLUCOSE: 101 mg/dL — AB (ref 65–99)
POTASSIUM: 3.6 mmol/L (ref 3.5–5.1)
Sodium: 136 mmol/L (ref 135–145)

## 2016-09-20 LAB — SURGICAL PCR SCREEN
MRSA, PCR: NEGATIVE
STAPHYLOCOCCUS AUREUS: NEGATIVE

## 2016-09-20 SURGERY — LAPAROSCOPIC CHOLECYSTECTOMY WITH INTRAOPERATIVE CHOLANGIOGRAM
Anesthesia: General

## 2016-09-20 MED ORDER — METOCLOPRAMIDE HCL 5 MG/ML IJ SOLN: 10.0000 mg | Freq: Once | INTRAMUSCULAR | Status: DC | PRN

## 2016-09-20 MED ORDER — SCOPOLAMINE 1 MG/3DAYS TD PT72
MEDICATED_PATCH | TRANSDERMAL | Status: AC
  Filled 2016-09-20: qty 1

## 2016-09-20 MED ORDER — DIPHENHYDRAMINE HCL 50 MG/ML IJ SOLN
INTRAMUSCULAR | Status: AC
  Filled 2016-09-20: qty 1

## 2016-09-20 MED ORDER — BUPROPION HCL ER (XL) 300 MG PO TB24
300.0000 mg | ORAL_TABLET | Freq: Every day | ORAL | Status: DC
  Administered 2016-09-20 – 2016-09-21 (×2): 300 mg via ORAL
  Filled 2016-09-20 (×2): qty 1

## 2016-09-20 MED ORDER — MIDAZOLAM HCL 2 MG/2ML IJ SOLN
INTRAMUSCULAR | Status: AC
  Filled 2016-09-20: qty 2

## 2016-09-20 MED ORDER — BUPIVACAINE HCL (PF) 0.5 % IJ SOLN
INTRAMUSCULAR | Status: AC
  Filled 2016-09-20: qty 30

## 2016-09-20 MED ORDER — SODIUM CHLORIDE 0.9 % IV SOLN
1.0000 g | INTRAVENOUS | Status: DC
  Administered 2016-09-20: 1 g via INTRAVENOUS
  Filled 2016-09-20 (×2): qty 1

## 2016-09-20 MED ORDER — FENTANYL CITRATE (PF) 100 MCG/2ML IJ SOLN
INTRAMUSCULAR | Status: DC | PRN
  Administered 2016-09-20 (×5): 50 ug via INTRAVENOUS

## 2016-09-20 MED ORDER — CITALOPRAM HYDROBROMIDE 20 MG PO TABS
40.0000 mg | ORAL_TABLET | Freq: Every day | ORAL | Status: DC
  Administered 2016-09-20 – 2016-09-21 (×2): 40 mg via ORAL
  Filled 2016-09-20 (×2): qty 2

## 2016-09-20 MED ORDER — SILVER NITRATE-POT NITRATE 75-25 % EX MISC
1.0000 | Freq: Once | CUTANEOUS | Status: DC | PRN
  Filled 2016-09-20: qty 1

## 2016-09-20 MED ORDER — IOPAMIDOL (ISOVUE-300) INJECTION 61%
INTRAVENOUS | Status: AC
  Filled 2016-09-20: qty 50

## 2016-09-20 MED ORDER — HYDRALAZINE HCL 20 MG/ML IJ SOLN: 10.0000 mg | INTRAMUSCULAR | Status: DC | PRN

## 2016-09-20 MED ORDER — PROPOFOL 10 MG/ML IV BOLUS
INTRAVENOUS | Status: DC | PRN
  Administered 2016-09-20: 200 mg via INTRAVENOUS

## 2016-09-20 MED ORDER — CLINDAMYCIN PHOSPHATE 900 MG/50ML IV SOLN
INTRAVENOUS | Status: AC
  Filled 2016-09-20: qty 50

## 2016-09-20 MED ORDER — OXYCODONE HCL 5 MG PO TABS: 5.0000 mg | ORAL_TABLET | ORAL | 0 refills | Status: AC | PRN

## 2016-09-20 MED ORDER — IBUPROFEN 200 MG PO TABS: 400.0000 mg | ORAL_TABLET | Freq: Four times a day (QID) | ORAL | Status: DC | PRN

## 2016-09-20 MED ORDER — ONDANSETRON HCL 4 MG/2ML IJ SOLN
INTRAMUSCULAR | Status: DC | PRN
  Administered 2016-09-20: 4 mg via INTRAVENOUS

## 2016-09-20 MED ORDER — BACITRACIN-NEOMYCIN-POLYMYXIN 400-5-5000 EX OINT: TOPICAL_OINTMENT | Freq: Once | CUTANEOUS | Status: DC | PRN

## 2016-09-20 MED ORDER — ACETAMINOPHEN 500 MG PO TABS
1000.0000 mg | ORAL_TABLET | Freq: Three times a day (TID) | ORAL | Status: DC
  Administered 2016-09-20 – 2016-09-21 (×3): 1000 mg via ORAL
  Filled 2016-09-20 (×3): qty 2

## 2016-09-20 MED ORDER — DIPHENHYDRAMINE HCL 12.5 MG/5ML PO ELIX
12.5000 mg | ORAL_SOLUTION | Freq: Four times a day (QID) | ORAL | Status: DC | PRN
  Administered 2016-09-20: 12.5 mg via ORAL
  Filled 2016-09-20: qty 5

## 2016-09-20 MED ORDER — CHLORTHALIDONE 50 MG PO TABS
50.0000 mg | ORAL_TABLET | Freq: Every day | ORAL | Status: DC
  Administered 2016-09-20: 50 mg via ORAL
  Filled 2016-09-20 (×2): qty 1

## 2016-09-20 MED ORDER — METOCLOPRAMIDE HCL 5 MG/ML IJ SOLN
INTRAMUSCULAR | Status: DC | PRN
  Administered 2016-09-20: 5 mg via INTRAVENOUS

## 2016-09-20 MED ORDER — SUGAMMADEX SODIUM 200 MG/2ML IV SOLN
INTRAVENOUS | Status: AC
  Filled 2016-09-20: qty 2

## 2016-09-20 MED ORDER — VITAMIN B-12 1000 MCG PO TABS
1000.0000 ug | ORAL_TABLET | Freq: Every day | ORAL | Status: DC
  Administered 2016-09-20 – 2016-09-21 (×2): 1000 ug via ORAL
  Filled 2016-09-20 (×2): qty 1

## 2016-09-20 MED ORDER — SODIUM CHLORIDE 0.9% FLUSH: 3.0000 mL | Freq: Two times a day (BID) | INTRAVENOUS | Status: DC

## 2016-09-20 MED ORDER — VANCOMYCIN HCL IN DEXTROSE 1-5 GM/200ML-% IV SOLN
INTRAVENOUS | Status: AC
  Filled 2016-09-20: qty 200

## 2016-09-20 MED ORDER — PROPOFOL 10 MG/ML IV BOLUS
INTRAVENOUS | Status: AC
  Filled 2016-09-20: qty 20

## 2016-09-20 MED ORDER — BUPIVACAINE HCL 0.5 % IJ SOLN
INTRAMUSCULAR | Status: DC | PRN
  Administered 2016-09-20: 30 mL

## 2016-09-20 MED ORDER — FUROSEMIDE 10 MG/ML IJ SOLN
10.0000 mg | Freq: Once | INTRAMUSCULAR | Status: AC
  Administered 2016-09-20: 10 mg via INTRAVENOUS

## 2016-09-20 MED ORDER — HYDROMORPHONE HCL 1 MG/ML IJ SOLN
INTRAMUSCULAR | Status: AC
  Filled 2016-09-20: qty 1

## 2016-09-20 MED ORDER — ETODOLAC 300 MG PO CAPS
500.0000 mg | ORAL_CAPSULE | Freq: Two times a day (BID) | ORAL | Status: DC
  Administered 2016-09-20: 500 mg via ORAL
  Filled 2016-09-20 (×2): qty 1

## 2016-09-20 MED ORDER — ATENOLOL-CHLORTHALIDONE 50-25 MG PO TABS: 1.0000 | ORAL_TABLET | Freq: Every day | ORAL | Status: DC

## 2016-09-20 MED ORDER — ETODOLAC 500 MG PO TABS: 500.0000 mg | ORAL_TABLET | Freq: Two times a day (BID) | ORAL | Status: DC

## 2016-09-20 MED ORDER — LIDOCAINE HCL 2 % EX GEL
1.0000 "application " | Freq: Once | CUTANEOUS | Status: DC | PRN
  Filled 2016-09-20: qty 5

## 2016-09-20 MED ORDER — SUCCINYLCHOLINE CHLORIDE 20 MG/ML IJ SOLN
INTRAMUSCULAR | Status: DC | PRN
  Administered 2016-09-20: 100 mg via INTRAVENOUS

## 2016-09-20 MED ORDER — ONDANSETRON HCL 4 MG/2ML IJ SOLN: 4.0000 mg | Freq: Four times a day (QID) | INTRAMUSCULAR | Status: DC | PRN

## 2016-09-20 MED ORDER — VANCOMYCIN HCL IN DEXTROSE 750-5 MG/150ML-% IV SOLN
750.0000 mg | Freq: Two times a day (BID) | INTRAVENOUS | Status: DC
  Administered 2016-09-20 – 2016-09-21 (×2): 750 mg via INTRAVENOUS
  Filled 2016-09-20 (×2): qty 150

## 2016-09-20 MED ORDER — ALBUTEROL SULFATE (2.5 MG/3ML) 0.083% IN NEBU
INHALATION_SOLUTION | RESPIRATORY_TRACT | Status: AC
  Administered 2016-09-20: 2.5 mg via RESPIRATORY_TRACT
  Filled 2016-09-20: qty 3

## 2016-09-20 MED ORDER — HYDROMORPHONE HCL 1 MG/ML IJ SOLN: 1.0000 mg | Freq: Once | INTRAMUSCULAR | Status: DC

## 2016-09-20 MED ORDER — VITAMIN D 1000 UNITS PO TABS
1000.0000 [IU] | ORAL_TABLET | Freq: Every day | ORAL | Status: DC
  Administered 2016-09-20 – 2016-09-21 (×2): 1000 [IU] via ORAL
  Filled 2016-09-20 (×2): qty 1

## 2016-09-20 MED ORDER — LIDOCAINE-EPINEPHRINE 1 %-1:100000 IJ SOLN
0.0000 mL | Freq: Once | INTRAMUSCULAR | Status: DC | PRN
  Filled 2016-09-20: qty 30

## 2016-09-20 MED ORDER — LIDOCAINE 2% (20 MG/ML) 5 ML SYRINGE
INTRAMUSCULAR | Status: AC
  Filled 2016-09-20: qty 5

## 2016-09-20 MED ORDER — ACETAMINOPHEN 500 MG PO TABS
ORAL_TABLET | ORAL | Status: AC
  Filled 2016-09-20: qty 2

## 2016-09-20 MED ORDER — HYDROMORPHONE HCL 1 MG/ML IJ SOLN: 0.2500 mg | INTRAMUSCULAR | Status: DC | PRN

## 2016-09-20 MED ORDER — 0.9 % SODIUM CHLORIDE (POUR BTL) OPTIME
TOPICAL | Status: DC | PRN
  Administered 2016-09-20: 1000 mL

## 2016-09-20 MED ORDER — ONDANSETRON 4 MG PO TBDP: 4.0000 mg | ORAL_TABLET | Freq: Four times a day (QID) | ORAL | Status: DC | PRN

## 2016-09-20 MED ORDER — MEPERIDINE HCL 50 MG/ML IJ SOLN: 6.2500 mg | INTRAMUSCULAR | Status: DC | PRN

## 2016-09-20 MED ORDER — SODIUM CHLORIDE 0.9 % IV SOLN: 250.0000 mL | INTRAVENOUS | Status: DC | PRN

## 2016-09-20 MED ORDER — DIPHENHYDRAMINE HCL 50 MG/ML IJ SOLN
12.5000 mg | Freq: Four times a day (QID) | INTRAMUSCULAR | Status: DC | PRN
  Administered 2016-09-20 (×2): 12.5 mg via INTRAVENOUS
  Filled 2016-09-20: qty 1

## 2016-09-20 MED ORDER — FENTANYL CITRATE (PF) 250 MCG/5ML IJ SOLN
INTRAMUSCULAR | Status: AC
  Filled 2016-09-20: qty 5

## 2016-09-20 MED ORDER — SUCCINYLCHOLINE CHLORIDE 200 MG/10ML IV SOSY
PREFILLED_SYRINGE | INTRAVENOUS | Status: AC
  Filled 2016-09-20: qty 10

## 2016-09-20 MED ORDER — DEXAMETHASONE SODIUM PHOSPHATE 10 MG/ML IJ SOLN
INTRAMUSCULAR | Status: AC
  Filled 2016-09-20: qty 1

## 2016-09-20 MED ORDER — CLINDAMYCIN PHOSPHATE 900 MG/50ML IV SOLN: 900.0000 mg | Freq: Four times a day (QID) | INTRAVENOUS | Status: DC

## 2016-09-20 MED ORDER — LACTATED RINGERS IR SOLN
Status: DC | PRN
  Administered 2016-09-20: 3000 mL

## 2016-09-20 MED ORDER — SIMETHICONE 80 MG PO CHEW: 40.0000 mg | CHEWABLE_TABLET | Freq: Four times a day (QID) | ORAL | Status: DC | PRN

## 2016-09-20 MED ORDER — SODIUM CHLORIDE 0.9% FLUSH: 3.0000 mL | INTRAVENOUS | Status: DC | PRN

## 2016-09-20 MED ORDER — ONDANSETRON HCL 4 MG/2ML IJ SOLN
INTRAMUSCULAR | Status: AC
  Filled 2016-09-20: qty 2

## 2016-09-20 MED ORDER — ROCURONIUM BROMIDE 100 MG/10ML IV SOLN
INTRAVENOUS | Status: DC | PRN
  Administered 2016-09-20: 40 mg via INTRAVENOUS

## 2016-09-20 MED ORDER — SCOPOLAMINE 1 MG/3DAYS TD PT72
1.0000 | MEDICATED_PATCH | TRANSDERMAL | Status: DC
  Administered 2016-09-20: 1.5 mg via TRANSDERMAL
  Filled 2016-09-20: qty 1

## 2016-09-20 MED ORDER — ENOXAPARIN SODIUM 40 MG/0.4ML ~~LOC~~ SOLN
40.0000 mg | Freq: Every day | SUBCUTANEOUS | Status: DC
  Administered 2016-09-20 (×2): 40 mg via SUBCUTANEOUS
  Filled 2016-09-20 (×2): qty 0.4

## 2016-09-20 MED ORDER — SUGAMMADEX SODIUM 200 MG/2ML IV SOLN
INTRAVENOUS | Status: DC | PRN
  Administered 2016-09-20: 200 mg via INTRAVENOUS

## 2016-09-20 MED ORDER — OXYMETAZOLINE HCL 0.05 % NA SOLN
1.0000 | Freq: Once | NASAL | Status: DC | PRN
  Filled 2016-09-20: qty 15

## 2016-09-20 MED ORDER — BUPIVACAINE LIPOSOME 1.3 % IJ SUSP
20.0000 mL | Freq: Once | INTRAMUSCULAR | Status: AC
  Administered 2016-09-20: 20 mL
  Filled 2016-09-20: qty 20

## 2016-09-20 MED ORDER — SODIUM CHLORIDE 0.9 % IJ SOLN
INTRAMUSCULAR | Status: AC
  Filled 2016-09-20: qty 50

## 2016-09-20 MED ORDER — LACTATED RINGERS IV SOLN
INTRAVENOUS | Status: DC
  Administered 2016-09-20: 1000 mL via INTRAVENOUS
  Administered 2016-09-20: 08:00:00 via INTRAVENOUS

## 2016-09-20 MED ORDER — DEXAMETHASONE SODIUM PHOSPHATE 10 MG/ML IJ SOLN
INTRAMUSCULAR | Status: DC | PRN
  Administered 2016-09-20: 10 mg via INTRAVENOUS

## 2016-09-20 MED ORDER — ROCURONIUM BROMIDE 50 MG/5ML IV SOSY
PREFILLED_SYRINGE | INTRAVENOUS | Status: AC
  Filled 2016-09-20: qty 5

## 2016-09-20 MED ORDER — ALBUTEROL SULFATE (2.5 MG/3ML) 0.083% IN NEBU
2.5000 mg | INHALATION_SOLUTION | Freq: Four times a day (QID) | RESPIRATORY_TRACT | Status: DC | PRN
  Administered 2016-09-20: 2.5 mg via RESPIRATORY_TRACT

## 2016-09-20 MED ORDER — LIDOCAINE HCL (CARDIAC) 20 MG/ML IV SOLN
INTRAVENOUS | Status: DC | PRN
Start: 2016-09-20 — End: 2016-09-20
  Administered 2016-09-20: 50 mg via INTRAVENOUS

## 2016-09-20 MED ORDER — GENTAMICIN SULFATE 40 MG/ML IJ SOLN: 2.0000 mg/kg | Freq: Two times a day (BID) | INTRAVENOUS | Status: DC

## 2016-09-20 MED ORDER — VANCOMYCIN HCL IN DEXTROSE 1-5 GM/200ML-% IV SOLN
1000.0000 mg | Freq: Once | INTRAVENOUS | Status: AC
  Administered 2016-09-20: 1000 mg via INTRAVENOUS
  Filled 2016-09-20: qty 200

## 2016-09-20 MED ORDER — LACTATED RINGERS IV BOLUS (SEPSIS): 1000.0000 mL | Freq: Three times a day (TID) | INTRAVENOUS | Status: DC | PRN

## 2016-09-20 MED ORDER — LIDOCAINE HCL 4 % EX SOLN
0.0000 mL | Freq: Once | CUTANEOUS | Status: DC | PRN
  Filled 2016-09-20: qty 50

## 2016-09-20 MED ORDER — FENTANYL CITRATE (PF) 100 MCG/2ML IJ SOLN: 25.0000 ug | INTRAMUSCULAR | Status: DC | PRN

## 2016-09-20 MED ORDER — NAPROXEN 500 MG PO TABS: 500.0000 mg | ORAL_TABLET | Freq: Two times a day (BID) | ORAL | 1 refills | Status: AC | PRN

## 2016-09-20 MED ORDER — ADULT MULTIVITAMIN W/MINERALS CH
1.0000 | ORAL_TABLET | Freq: Every day | ORAL | Status: DC
  Administered 2016-09-20 – 2016-09-21 (×2): 1 via ORAL
  Filled 2016-09-20 (×2): qty 1

## 2016-09-20 MED ORDER — ATENOLOL 50 MG PO TABS
50.0000 mg | ORAL_TABLET | Freq: Every day | ORAL | Status: DC
  Administered 2016-09-20 – 2016-09-21 (×2): 50 mg via ORAL
  Filled 2016-09-20 (×2): qty 1

## 2016-09-20 MED ORDER — DEXTROSE 5 % IV SOLN: 2.0000 g | INTRAVENOUS | Status: DC

## 2016-09-20 SURGICAL SUPPLY — 41 items
APPLIER CLIP 5 13 M/L LIGAMAX5 (MISCELLANEOUS) ×3
APPLIER CLIP ROT 10 11.4 M/L (STAPLE)
CABLE HIGH FREQUENCY MONO STRZ (ELECTRODE) ×3 IMPLANT
CLIP APPLIE 5 13 M/L LIGAMAX5 (MISCELLANEOUS) ×1 IMPLANT
CLIP APPLIE ROT 10 11.4 M/L (STAPLE) IMPLANT
COVER MAYO STAND STRL (DRAPES) ×3 IMPLANT
COVER SURGICAL LIGHT HANDLE (MISCELLANEOUS) ×3 IMPLANT
DECANTER SPIKE VIAL GLASS SM (MISCELLANEOUS) ×3 IMPLANT
DRAPE C-ARM 42X120 X-RAY (DRAPES) ×3 IMPLANT
DRAPE UTILITY XL STRL (DRAPES) ×3 IMPLANT
DRAPE WARM FLUID 44X44 (DRAPE) ×3 IMPLANT
DRSG TEGADERM 2-3/8X2-3/4 SM (GAUZE/BANDAGES/DRESSINGS) ×9 IMPLANT
DRSG TEGADERM 4X4.75 (GAUZE/BANDAGES/DRESSINGS) ×3 IMPLANT
ELECT REM PT RETURN 9FT ADLT (ELECTROSURGICAL) ×3
ELECTRODE REM PT RTRN 9FT ADLT (ELECTROSURGICAL) ×1 IMPLANT
ENDOLOOP SUT PDS II  0 18 (SUTURE) ×2
ENDOLOOP SUT PDS II 0 18 (SUTURE) ×1 IMPLANT
GAUZE SPONGE 2X2 8PLY STRL LF (GAUZE/BANDAGES/DRESSINGS) ×1 IMPLANT
GLOVE ECLIPSE 8.0 STRL XLNG CF (GLOVE) ×3 IMPLANT
GLOVE INDICATOR 8.0 STRL GRN (GLOVE) ×3 IMPLANT
GOWN STRL REUS W/TWL XL LVL3 (GOWN DISPOSABLE) ×6 IMPLANT
IRRIG SUCT STRYKERFLOW 2 WTIP (MISCELLANEOUS) ×3
IRRIGATION SUCT STRKRFLW 2 WTP (MISCELLANEOUS) ×1 IMPLANT
KIT BASIN OR (CUSTOM PROCEDURE TRAY) ×3 IMPLANT
POSITIONER SURGICAL ARM (MISCELLANEOUS) ×3 IMPLANT
POUCH SPECIMEN RETRIEVAL 10MM (ENDOMECHANICALS) ×3 IMPLANT
SCISSORS LAP 5X35 DISP (ENDOMECHANICALS) ×3 IMPLANT
SET CHOLANGIOGRAPH MIX (MISCELLANEOUS) ×3 IMPLANT
SET IRRIG TUBING LAPAROSCOPIC (IRRIGATION / IRRIGATOR) ×3 IMPLANT
SHEARS HARMONIC ACE PLUS 45CM (MISCELLANEOUS) ×3 IMPLANT
SLEEVE XCEL OPT CAN 5 100 (ENDOMECHANICALS) ×6 IMPLANT
SPONGE GAUZE 2X2 STER 10/PKG (GAUZE/BANDAGES/DRESSINGS) ×2
SUT MNCRL AB 4-0 PS2 18 (SUTURE) ×3 IMPLANT
SUT PDS AB 1 CT1 27 (SUTURE) ×18 IMPLANT
SYR 20CC LL (SYRINGE) ×3 IMPLANT
TOWEL OR 17X26 10 PK STRL BLUE (TOWEL DISPOSABLE) ×3 IMPLANT
TOWEL OR NON WOVEN STRL DISP B (DISPOSABLE) ×3 IMPLANT
TRAY LAPAROSCOPIC (CUSTOM PROCEDURE TRAY) ×3 IMPLANT
TROCAR BLADELESS OPT 5 100 (ENDOMECHANICALS) ×3 IMPLANT
TROCAR XCEL NON-BLD 11X100MML (ENDOMECHANICALS) ×3 IMPLANT
TUBING INSUF HEATED (TUBING) ×3 IMPLANT

## 2016-09-20 NOTE — Progress Notes (Signed)
Patient report feeling of swelling / tenderness on her right carotid area, shows concern if it's a allergic reaction. Patient was assessed and agree to have benadryl. We will continue to monitor and assessed the patient.

## 2016-09-20 NOTE — Progress Notes (Signed)
[facility]  [address] [place], [place] [address] Phone: [phone] FAX: [place] [phone] 1966/04/20  CARE TEAM:  PCP: Pcp Not In System  Outpatient Care Team: Patient Care Team: Pcp Not In System as PCP - General  Inpatient Treatment Team: Treatment Team: Attending Provider: Michael Boston, MD; Consulting Physician: Nolon Nations, MD; Technician: Leda Quail, NT  Problem List:   Principal Problem:   Acute cholecystitis with chronic cholecystitis Active Problems:   Hypokalemia   Steatosis of liver   Obesity (BMI 30-39.9)   Chronic low back pain   Neck pain on right side, probable parotiditis   Day of Surgery    Assessment  Acute cholecystitis  Itching ? Reaction to dilaudid vs ceftriaxone/  Heat rash?  New facial swelling  Plan:  -lap chole this AM  I have re-reviewed the the patient's records, history, medications, and allergies.  I have re-examined the patient.  I again discussed intraoperative plans and goals of post-operative recovery.  The patient agrees to proceed.  -itching ? Heat rash vs reaction?  Started last night - switch ABx & narcotics  -right upper neck swelling & tenderness ? Parotiditis -   Heat.  Place on vancomycin and ertapenem (since possible cephalosporin allergy with itching).  Answering service not picking up.  I left a message on Dr [facility] cell phone.  He call back five minutes later.  He will see the patient postop.  I discussed with your front desk.  They will have an ENT tray with the patient at bedside.  OR staff notes there is an ENT cart available as well.  They will wait to hear from him should he need that.  -VTE prophylaxis- SCDs, etc -mobilize as tolerated to help recovery  Adin Hector, M.D., F.A.C.S. Gastrointestinal and Minimally Invasive Surgery [facility] [address] [place], [place] [address] [phone]  Main / Paging   09/20/2016  Subjective:  Complaining of new swelling and pain under her right jaw at her neck.  She has never had this before.  Uncomfortable to swallow.  No problems with breathing.  No hoarseness.  No purulent drainage.  No major tonsillitis that she knows of.  No history of mumps that she knows of.  Had itching starting last night.  Has not gotten antibiotics since in the ER.  Objective:  Vital signs:  Vitals:   09/19/16 2200 09/19/16 2231 09/19/16 2328 09/20/16 0050  BP: 144/79 131/65 137/79 136/76  Pulse: 75 83 76 83  Resp: 23 18 16 18   Temp:  98.3 F (36.8 C) 98.2 F (36.8 C) 98.9 F (37.2 C)  TempSrc:  Oral Oral Oral  SpO2: 99% 99% 98% 98%  Weight:    108.9 kg (240 lb)  Height:    5' 6"  (1.676 m)    Last BM Date: 09/19/16  Intake/Output   Yesterday:  11/23 0701 - 11/24 0700 In: 1638.3 [I.V.:488.3; IV Piggyback:1150] Out: 0  This shift:  No intake/output data recorded.  Bowel function:  Flatus: YES  BM:  No  Drain: (No drain)   Physical Exam:  General: Pt awake/alert/oriented x4 in mild acute distress Eyes: PERRL, normal EOM.  Sclera clear.  No icterus Neuro: CN II-XII intact w/o focal sensory/motor deficits. Lymph: No head/neck/groin lymphadenopathy Psych:  No delerium/psychosis/paranoia HENT: Normocephalic, Mucus membranes moist.  Oropharynx clear.  No frank purulence noted.  No thrush Neck: Swelling and tenderness in upper lateral  neck just anterior posterior to angle of mandible.  Some swelling there as well.  Rest of the neck is supple.   No tracheal deviation Chest: No chest wall pain w good excursion CV:  Pulses intact.  Regular rhythm MS: Normal AROM mjr joints.  No obvious deformity Abdomen: Soft.  Nondistended.  Tenderness at RUQ, epigastric, bellybutton.  No evidence of peritonitis.  No incarcerated hernias. Ext:  SCDs BLE.  No mjr edema.  No cyanosis Skin: No petechiae / purpura  Results:   Labs: Results for orders  placed or performed during the hospital encounter of 09/19/16 (from the past 48 hour(s))  CBC with Differential/Platelet     Status: Abnormal   Collection Time: 09/19/16  6:23 PM  Result Value Ref Range   WBC 8.1 4.0 - 10.5 K/uL   RBC 3.84 (L) 3.87 - 5.11 MIL/uL   Hemoglobin 11.7 (L) 12.0 - 15.0 g/dL   HCT 35.3 (L) 36.0 - 46.0 %   MCV 91.9 78.0 - 100.0 fL   MCH 30.5 26.0 - 34.0 pg   MCHC 33.1 30.0 - 36.0 g/dL   RDW 12.2 11.5 - 15.5 %   Platelets 266 150 - 400 K/uL   Neutrophils Relative % 69 %   Neutro Abs 5.6 1.7 - 7.7 K/uL   Lymphocytes Relative 23 %   Lymphs Abs 1.9 0.7 - 4.0 K/uL   Monocytes Relative 5 %   Monocytes Absolute 0.4 0.1 - 1.0 K/uL   Eosinophils Relative 3 %   Eosinophils Absolute 0.2 0.0 - 0.7 K/uL   Basophils Relative 0 %   Basophils Absolute 0.0 0.0 - 0.1 K/uL  Comprehensive metabolic panel     Status: Abnormal   Collection Time: 09/19/16  6:23 PM  Result Value Ref Range   Sodium 137 135 - 145 mmol/L   Potassium 2.9 (L) 3.5 - 5.1 mmol/L   Chloride 99 (L) 101 - 111 mmol/L   CO2 31 22 - 32 mmol/L   Glucose, Bld 120 (H) 65 - 99 mg/dL   BUN 14 6 - 20 mg/dL   Creatinine, Ser 1.04 (H) 0.44 - 1.00 mg/dL   Calcium 9.9 8.9 - 10.3 mg/dL   Total Protein 6.8 6.5 - 8.1 g/dL   Albumin 3.7 3.5 - 5.0 g/dL   AST 29 15 - 41 U/L   ALT 36 14 - 54 U/L   Alkaline Phosphatase 76 38 - 126 U/L   Total Bilirubin 0.4 0.3 - 1.2 mg/dL   GFR calc non Af Amer >60 >60 mL/min   GFR calc Af Amer >60 >60 mL/min    Comment: (NOTE) The eGFR has been calculated using the CKD EPI equation. This calculation has not been validated in all clinical situations. eGFR's persistently <60 mL/min signify possible Chronic Kidney Disease.    Anion gap 7 5 - 15  Lipase, blood     Status: None   Collection Time: 09/19/16  6:23 PM  Result Value Ref Range   Lipase 29 11 - 51 U/L  Urinalysis, Routine w reflex microscopic (not at [facility])     Status: Abnormal   Collection Time: 09/19/16  6:23 PM   Result Value Ref Range   Color, Urine YELLOW YELLOW   APPearance CLEAR CLEAR   Specific Gravity, Urine 1.008 1.005 - 1.030   pH 7.0 5.0 - 8.0   Glucose, UA NEGATIVE NEGATIVE mg/dL   Hgb urine dipstick NEGATIVE NEGATIVE   Bilirubin Urine MODERATE (A) NEGATIVE   Ketones, ur NEGATIVE  NEGATIVE mg/dL   Protein, ur NEGATIVE NEGATIVE mg/dL   Nitrite NEGATIVE NEGATIVE   Leukocytes, UA NEGATIVE NEGATIVE    Comment: MICROSCOPIC NOT DONE ON URINES WITH NEGATIVE PROTEIN, BLOOD, LEUKOCYTES, NITRITE, OR GLUCOSE <1000 mg/dL.  Magnesium     Status: None   Collection Time: 09/19/16  6:23 PM  Result Value Ref Range   Magnesium 1.9 1.7 - 2.4 mg/dL  Surgical pcr screen     Status: None   Collection Time: 09/20/16  1:41 AM  Result Value Ref Range   MRSA, PCR NEGATIVE NEGATIVE   Staphylococcus aureus NEGATIVE NEGATIVE    Comment:        The Xpert SA Assay (FDA approved for NASAL specimens in patients over 23 years of age), is one component of a comprehensive surveillance program.  Test performance has been validated by [facility] for patients greater than or equal to 60 year old. It is not intended to diagnose infection nor to guide or monitor treatment.   Basic metabolic panel     Status: Abnormal   Collection Time: 09/20/16  4:53 AM  Result Value Ref Range   Sodium 136 135 - 145 mmol/L   Potassium 3.6 3.5 - 5.1 mmol/L   Chloride 102 101 - 111 mmol/L   CO2 29 22 - 32 mmol/L   Glucose, Bld 101 (H) 65 - 99 mg/dL   BUN 10 6 - 20 mg/dL   Creatinine, Ser 0.86 0.44 - 1.00 mg/dL   Calcium 8.9 8.9 - 10.3 mg/dL   GFR calc non Af Amer >60 >60 mL/min   GFR calc Af Amer >60 >60 mL/min    Comment: (NOTE) The eGFR has been calculated using the CKD EPI equation. This calculation has not been validated in all clinical situations. eGFR's persistently <60 mL/min signify possible Chronic Kidney Disease.    Anion gap 5 5 - 15    Imaging / Studies: US Abdomen Complete  Result Date:  09/19/2016 CLINICAL DATA:  Periumbilical and epigastric pain for 1 day. EXAM: ABDOMEN ULTRASOUND COMPLETE COMPARISON:  None. FINDINGS: Gallbladder: Cholelithiasis with over distended gallbladder that is focally tender, thick walled (5 mm), and shows pericholecystic edema. Common bile duct: Diameter: 3 mm Liver: Diffusely echogenic. No evidence of mass. Antegrade flow in the imaged hepatic and portal venous system. IVC: No abnormality visualized. Pancreas: Visualized portion unremarkable. Spleen: Size and appearance within normal limits. Right Kidney: Length: 11.5 cm. Echogenicity within normal limits. No mass or hydronephrosis visualized. Left Kidney: Length: 11 cm. Echogenicity within normal limits. No mass or hydronephrosis visualized. Abdominal aorta: No aneurysm visualized. IMPRESSION: 1. Positive for cholelithiasis and signs of acute cholecystitis. 2. Hepatic steatosis. Electronically Signed   By: Monte Fantasia M.D.   On: 09/19/2016 19:36    Medications / Allergies: per chart  Antibiotics: Anti-infectives    Start     Dose/Rate Route Frequency Ordered Stop   09/20/16 2000  cefTRIAXone (ROCEPHIN) 2 g in dextrose 5 % 50 mL IVPB     2 g 100 mL/hr over 30 Minutes Intravenous Every 24 hours 09/20/16 0008     09/19/16 2030  cefTRIAXone (ROCEPHIN) 2 g in dextrose 5 % 50 mL IVPB  Status:  Discontinued    Comments:  Pharmacy may adjust dosing strength / duration / interval for maximal efficacy   2 g 100 mL/hr over 30 Minutes Intravenous Every 24 hours 09/19/16 2023 09/20/16 0008        Note: Portions of this report may have been transcribed  using voice recognition software. Every effort was made to ensure accuracy; however, inadvertent computerized transcription errors may be present.   Any transcriptional errors that result from this process are unintentional.     Adin Hector, M.D., F.A.C.S. Gastrointestinal and Minimally Invasive Surgery [facility] [address] [place], [place] [address] [phone] Main / Paging   09/20/2016

## 2016-09-20 NOTE — Consult Note (Signed)
Rebecca Tyler, Rebecca Tyler 50 y.o., female [id number]     Chief Complaint: RIGHT cheek swelling  HPI: 50 yo bf, onset RIGHT cheek swelling and tenderness yesterday.  Hospitalized for acute on chronic cholecystitis.  Had a possible allergic reaction with rash and itching to Rocephin or similar.  The swelling dissipated spontaneously fairly rapidly.  No fever.  No prior similar episodes.  No obvious recent dehydration.  No radiocontrast dye.  No trismus, breathing, or swallowing difficulty.  No change in swelling or pain with meals.  LEFT gland unaffected.  PMH: Past Medical History:  Diagnosis Date  . Anxiety     Surg Hx: Past Surgical History:  Procedure Laterality Date  . LIPOSUCTION      FHx:  History reviewed. No pertinent family history. SocHx:  reports that she has never smoked. She has never used smokeless tobacco. She reports that she does not drink alcohol or use drugs.  ALLERGIES:  Allergies  Allergen Reactions  . Sulfa Antibiotics Rash    Medications Prior to Admission  Medication Sig Dispense Refill  . atenolol-chlorthalidone (TENORETIC) 50-25 MG tablet Take 1 tablet by mouth daily.    Marland Kitchen buPROPion (WELLBUTRIN SR) 150 MG 12 hr tablet Take 150 mg by mouth daily.     Marland Kitchen buPROPion (WELLBUTRIN XL) 300 MG 24 hr tablet Take 300 mg by mouth daily.    . cholecalciferol (VITAMIN D) 1000 units tablet Take 1,000 Units by mouth daily.    . citalopram (CELEXA) 20 MG tablet Take 40 mg by mouth daily.    Marland Kitchen etodolac (LODINE) 500 MG tablet Take 500 mg by mouth 2 (two) times daily.    Marland Kitchen GLUCOSAMINE HCL PO Take 1 tablet by mouth daily.    . Multiple Vitamin (MULTIVITAMIN WITH MINERALS) TABS tablet Take 1 tablet by mouth daily.    Marland Kitchen VITAMIN A PO Take 1 tablet by mouth daily.    . vitamin B-12 (CYANOCOBALAMIN) 1000 MCG tablet Take 1,000 mcg by mouth daily.      Results for orders placed or performed during the hospital encounter of 09/19/16 (from the past 48 hour(s))  CBC with  Differential/Platelet     Status: Abnormal   Collection Time: 09/19/16  6:23 PM  Result Value Ref Range   WBC 8.1 4.0 - 10.5 K/uL   RBC 3.84 (L) 3.87 - 5.11 MIL/uL   Hemoglobin 11.7 (L) 12.0 - 15.0 g/dL   HCT 35.3 (L) 36.0 - 46.0 %   MCV 91.9 78.0 - 100.0 fL   MCH 30.5 26.0 - 34.0 pg   MCHC 33.1 30.0 - 36.0 g/dL   RDW 12.2 11.5 - 15.5 %   Platelets 266 150 - 400 K/uL   Neutrophils Relative % 69 %   Neutro Abs 5.6 1.7 - 7.7 K/uL   Lymphocytes Relative 23 %   Lymphs Abs 1.9 0.7 - 4.0 K/uL   Monocytes Relative 5 %   Monocytes Absolute 0.4 0.1 - 1.0 K/uL   Eosinophils Relative 3 %   Eosinophils Absolute 0.2 0.0 - 0.7 K/uL   Basophils Relative 0 %   Basophils Absolute 0.0 0.0 - 0.1 K/uL  Comprehensive metabolic panel     Status: Abnormal   Collection Time: 09/19/16  6:23 PM  Result Value Ref Range   Sodium 137 135 - 145 mmol/L   Potassium 2.9 (L) 3.5 - 5.1 mmol/L   Chloride 99 (L) 101 - 111 mmol/L   CO2 31 22 - 32 mmol/L   Glucose, Bld 120 (H)  65 - 99 mg/dL   BUN 14 6 - 20 mg/dL   Creatinine, Ser 1.04 (H) 0.44 - 1.00 mg/dL   Calcium 9.9 8.9 - 10.3 mg/dL   Total Protein 6.8 6.5 - 8.1 g/dL   Albumin 3.7 3.5 - 5.0 g/dL   AST 29 15 - 41 U/L   ALT 36 14 - 54 U/L   Alkaline Phosphatase 76 38 - 126 U/L   Total Bilirubin 0.4 0.3 - 1.2 mg/dL   GFR calc non Af Amer >60 >60 mL/min   GFR calc Af Amer >60 >60 mL/min    Comment: (NOTE) The eGFR has been calculated using the CKD EPI equation. This calculation has not been validated in all clinical situations. eGFR's persistently <60 mL/min signify possible Chronic Kidney Disease.    Anion gap 7 5 - 15  Lipase, blood     Status: None   Collection Time: 09/19/16  6:23 PM  Result Value Ref Range   Lipase 29 11 - 51 U/L  Urinalysis, Routine w reflex microscopic (not at [facility])     Status: Abnormal   Collection Time: 09/19/16  6:23 PM  Result Value Ref Range   Color, Urine YELLOW YELLOW   APPearance CLEAR CLEAR   Specific Gravity, Urine  1.008 1.005 - 1.030   pH 7.0 5.0 - 8.0   Glucose, UA NEGATIVE NEGATIVE mg/dL   Hgb urine dipstick NEGATIVE NEGATIVE   Bilirubin Urine MODERATE (A) NEGATIVE   Ketones, ur NEGATIVE NEGATIVE mg/dL   Protein, ur NEGATIVE NEGATIVE mg/dL   Nitrite NEGATIVE NEGATIVE   Leukocytes, UA NEGATIVE NEGATIVE    Comment: MICROSCOPIC NOT DONE ON URINES WITH NEGATIVE PROTEIN, BLOOD, LEUKOCYTES, NITRITE, OR GLUCOSE <1000 mg/dL.  Magnesium     Status: None   Collection Time: 09/19/16  6:23 PM  Result Value Ref Range   Magnesium 1.9 1.7 - 2.4 mg/dL  Surgical pcr screen     Status: None   Collection Time: 09/20/16  1:41 AM  Result Value Ref Range   MRSA, PCR NEGATIVE NEGATIVE   Staphylococcus aureus NEGATIVE NEGATIVE    Comment:        The Xpert SA Assay (FDA approved for NASAL specimens in patients over 46 years of age), is one component of a comprehensive surveillance program.  Test performance has been validated by [facility] for patients greater than or equal to 32 year old. It is not intended to diagnose infection nor to guide or monitor treatment.   Basic metabolic panel     Status: Abnormal   Collection Time: 09/20/16  4:53 AM  Result Value Ref Range   Sodium 136 135 - 145 mmol/L   Potassium 3.6 3.5 - 5.1 mmol/L   Chloride 102 101 - 111 mmol/L   CO2 29 22 - 32 mmol/L   Glucose, Bld 101 (H) 65 - 99 mg/dL   BUN 10 6 - 20 mg/dL   Creatinine, Ser 0.86 0.44 - 1.00 mg/dL   Calcium 8.9 8.9 - 10.3 mg/dL   GFR calc non Af Amer >60 >60 mL/min   GFR calc Af Amer >60 >60 mL/min    Comment: (NOTE) The eGFR has been calculated using the CKD EPI equation. This calculation has not been validated in all clinical situations. eGFR's persistently <60 mL/min signify possible Chronic Kidney Disease.    Anion gap 5 5 - 15   Dg Cholangiogram Operative  Result Date: 09/20/2016 CLINICAL DATA:  Right upper quadrant pain. EXAM: INTRAOPERATIVE CHOLANGIOGRAM TECHNIQUE: Cholangiographic images  from the  C-arm fluoroscopic device were submitted for interpretation post-operatively. Please see the procedural report for the amount of contrast and the fluoroscopy time utilized. COMPARISON:  Ultrasound 09/19/2016 FINDINGS: Opacification of the intrahepatic and extrahepatic biliary system. There is no biliary dilatation. Contrast rapidly drains into the duodenum. There are no large filling defects or stones. IMPRESSION: Patent biliary system.  Normal intraoperative cholangiogram. Electronically Signed   By: Markus Daft M.D.   On: 09/20/2016 11:44   US Abdomen Complete  Result Date: 09/19/2016 CLINICAL DATA:  Periumbilical and epigastric pain for 1 day. EXAM: ABDOMEN ULTRASOUND COMPLETE COMPARISON:  None. FINDINGS: Gallbladder: Cholelithiasis with over distended gallbladder that is focally tender, thick walled (5 mm), and shows pericholecystic edema. Common bile duct: Diameter: 3 mm Liver: Diffusely echogenic. No evidence of mass. Antegrade flow in the imaged hepatic and portal venous system. IVC: No abnormality visualized. Pancreas: Visualized portion unremarkable. Spleen: Size and appearance within normal limits. Right Kidney: Length: 11.5 cm. Echogenicity within normal limits. No mass or hydronephrosis visualized. Left Kidney: Length: 11 cm. Echogenicity within normal limits. No mass or hydronephrosis visualized. Abdominal aorta: No aneurysm visualized. IMPRESSION: 1. Positive for cholelithiasis and signs of acute cholecystitis. 2. Hepatic steatosis. Electronically Signed   By: Monte Fantasia M.D.   On: 09/19/2016 19:36   Dg Chest Port 1 View  Result Date: 09/20/2016 CLINICAL DATA:  Postop, shortness of breath, hypoxia EXAM: PORTABLE CHEST 1 VIEW COMPARISON:  None. FINDINGS: Cardiomediastinal silhouette is unremarkable. No infiltrate or pleural effusion. No pulmonary edema. Old left rib fracture deformity. IMPRESSION: No active disease. Electronically Signed   By: Lahoma Crocker M.D.   On: 09/20/2016 14:03     Blood pressure 133/68, pulse 96, temperature 99.7 F (37.6 C), resp. rate 19, height 5' 6"  (1.676 m), weight 108.9 kg (240 lb), SpO2 91 %.  PHYSICAL EXAM: Overall appearance:  Sl groggy s/p anesthesia earlier today. Head:  NCAT Ears:  clear Nose:  Sl dry c/w nasal cannula O2 Oral Cavity:  Moist with teeth in good repair.  No saliva expressed from either Stensen's papilla.  No palpable stone in duct on RIGHT side. Oral Pharynx/Hypopharynx/Larynx:  Small residual tonsils Neuro: grossly intact Neck:  Min swelling but sl tenderness, RIGHT parotid gland. No overlying skin changes.  No mass. No nodes.  LEFT parotid and neck nl.  Studies Reviewed: none    Assessment/Plan  Acute RIGHT parotitis.  Possibly a manifestation of an allergic reaction, possible viral vs bacterial infection  Plan:  I discussed with her and her significant other.  Recommend elevation, warm compresses, sialogogues (foods to stimulate salivation), hydration, "milking" the gland to express and retained secretions, antibiosis to cover staph.  Could switch to oral abx when ready.  Recheck ENT 2 weeks back home, sooner as needed.    Jodi Marble 67/34/1938, 3:58 PM

## 2016-09-20 NOTE — Progress Notes (Signed)
Pharmacy Antibiotic Note  Rebecca CordsBetty Tyler is a 50 y.o. female admitted on 09/19/2016 with parotid gland infection.  Pharmacy has been consulted for vancomycin dosing.  Pt with scheduled lap chole this AM ( 11/24) Pt with possible parotiditis with  right upper neck swelling & tenderness Antibiotics are being switched to  vancomycin and ertapenem since pt had a possible cephalosporin allergy with itching.   Plan: Ertapenem 1 gr IV q24 ( MD)  Vancomycin 1000 mg IV x1 pre-op, then 750 mg IV q12h  Height: 5\' 6"  (167.6 cm) Weight: 240 lb (108.9 kg) IBW/kg (Calculated) : 59.3  Temp (24hrs), Avg:98.5 F (36.9 C), Min:98.2 F (36.8 C), Max:98.9 F (37.2 C)   Recent Labs Lab 09/19/16 1823 09/20/16 0453  WBC 8.1  --   CREATININE 1.04* 0.86    Estimated Creatinine Clearance: 96.6 mL/min (by C-G formula based on SCr of 0.86 mg/dL).    Allergies  Allergen Reactions  . Sulfa Antibiotics Rash    Antimicrobials this admission: ceftriaxone 11/23 >> 11/24 ( possible allergic reaction) Vancomycin 11/24 >>  Ertapenem 11/23 >>  Dose adjustments this admission:   Microbiology results: 11/24  MRSA PCR: negative  Thank you for allowing pharmacy to be a part of this patient's care.   Adalberto ColeNikola Neliah Cuyler, PharmD, BCPS Pager [phone] 09/20/2016 10:37 AM

## 2016-09-20 NOTE — Op Note (Signed)
09/19/2016 - 09/20/2016  11:58 AM  PATIENT:  Rebecca Tyler  50 y.o. female  Patient Care Team: Pcp Not In System as PCP - General  PRE-OPERATIVE DIAGNOSIS:  Cholelithiasis with acute cholecystitis  POST-OPERATIVE DIAGNOSIS:  Cholelithiasis with acute cholecystitis Fatty change in the liver Umbilical hernia  PROCEDURE:    LAPAROSCOPIC CHOLECYSTECTOMY WITH INTRAOPERATIVE CHOLANGIOGRAM Primary umbilical hernia repair  SURGEON:  Surgeon(s): Karie SodaSteven Eiden Bagot, MD  ASSISTANT: rn   ANESTHESIA:   local and general  EBL:  Total I/O In: 1 [P.O.:1] Out: 500 [Urine:500]  Delay start of Pharmacological VTE agent (>24hrs) due to surgical blood loss or risk of bleeding:  no  DRAINS: none   SPECIMEN:  Source of Specimen:  Gallbladder   DISPOSITION OF SPECIMEN:  PATHOLOGY  COUNTS:  YES  PLAN OF CARE: Admit to inpatient   PATIENT DISPOSITION:  PACU - hemodynamically stable.  INDICATION: Poorly obese female with severe umbilical and right upper quadrant pain refractory to her usual chronic pain medications.  Exam and ultrasound suspicious for cholecystitis.  Recommended admission IV antibiotics and cholecystectomy"  The anatomy & physiology of hepatobiliary & pancreatic function was discussed.  The pathophysiology of gallbladder dysfunction was discussed.  Natural history risks without surgery was discussed.   I feel the risks of no intervention will lead to serious problems that outweigh the operative risks; therefore, I recommended cholecystectomy to remove the pathology.  I explained laparoscopic techniques with possible need for an open approach.  Probable cholangiogram to evaluate the bilary tract was explained as well.    Risks such as bleeding, infection, abscess, leak, injury to other organs, need for further treatment, heart attack, death, and other risks were discussed.  I noted a good likelihood this will help address the problem.  Possibility that this will not correct all  abdominal symptoms was explained.  Goals of post-operative recovery were discussed as well.  We will work to minimize complications.  An educational handout further explaining the pathology and treatment options was given as well.  Questions were answered.  The patient expresses understanding & wishes to proceed with surgery.   OR FINDINGS: Very large elongated gallbladder with tip reaching down to the umbilicus.  Edema adhesions consistent with acute on chronic cholecystitis.  Three giant gallstones within it.  Cholangiogram showing normal biliary anatomy without any choledocholithiasis leak or tumor.  Some mild fatty change in liver but no evidence of any cirrhosis.  1 cm umbilical hernia through the stalk.  Primarily repaired  DESCRIPTION:   The patient was identified & brought in the operating room. The patient was positioned supine with arms tucked. SCDs were active during the entire case. The patient underwent general anesthesia without any difficulty.  The abdomen was prepped and draped in a sterile fashion. A Surgical Timeout confirmed our plan.  I made a transverse curvilinear incision through the superior umbilical fold.  I placed a 5mm long port through the supraumbilical fascia using a modified Hassan cutdown technique. I began carbon dioxide insufflation. Camera inspection revealed no injury. There were no adhesions to the anterior abdominal wall supraumbilically.  I proceeded to continue with single site technique. I placed a #5 port in left upper aspect of the wound. I placed a 5 mm atraumatic grasper in the right inferior aspect of the wound.  I turned attention to the right upper quadrant.  The gallbladder was quite dilated and elongated.  It stretched way past the liver edge with the tip at the level of the umbilicus.  Very edematous.  Could not grasp it.  Therefore did not puncture aspiration of some thinly purulent bile.  That helped better decompress the gallbladder.  Quite  apparent that she had a couple large stones within it about the size of Cadbury eggs.  Another one about the size of a Print production plannergranny marble.  The gallbladder fundus was elevated cephalad. I freed the peritoneal coverings between the gallbladder and the liver on the posteriolateral and anteriomedial walls. I alternated between Harmonic & blunt Maryland dissection to help get a good critical view of the cystic artery and cystic duct. I did further dissection to free the rest of the gallbladder after the liver bed as the gallbladder was quite stretched and twisted upon itself.  That help straighten the gallbladder out.  Essentially did a dome down and medial to lateral dissection   I could get get a good critical view of the infundibulum and cystic duct. I mobilized the cystic artery; and, after getting a good 360 view, ligated the cystic artery using the Harmonic ultrasonic dissection.   I skeletonized the cystic duct.  I placed a clip on the infundibulum. I did a partial cystic duct-otomy and ensured patency. I placed a 5 [place] cholangiocatheter through a puncture site at the right subcostal ridge of the abdominal wall and directed it into the cystic duct.  We ran a cholangiogram with dilute radio-opaque contrast and continuous fluoroscopy. Contrast flowed from a side branch consistent with cystic duct cannulization. Contrast flowed up the common hepatic duct into the right and left intrahepatic chains out to secondary radicals. Contrast flowed down the common bile duct easily across the normal ampulla into the duodenum.  This was consistent with a normal cholangiogram.  I removed the cholangiocatheter.  Because the edema enlargement of the cystic duct, I did a ligation where a 0 PDS Endoloop over the gallbladder crossing given down to the cystic duct/common bile duct junction.  I then placed clips on the cystic duct x4 more distally.  I completed cystic duct transection.  I ensured hemostasis on the gallbladder fossa  of the liver and elsewhere. I inspected the rest of the abdomen & detected no injury nor bleeding elsewhere.  I did copious irrigation of over 3 L with good return.  Again hemostasis good on the liver bed.  In other organs not injured or inflamed.  I placed the gallbladder inside an Endo catch bag.  It barely fit being decompressed.  I removed the gallbladder out the supraumbilical fascia.   I had to open up the defect 4 cm just to get the thickened gallbladder with giant gallstones out.  Patient had an umbilical hernia so opened up through that as well.  I primary close the umbilical hernia and fascia using #1 PDS interrupted stitches. I closed the skin using 4-0 monocryl stitch.  Sterile dressing was applied.   The patient was extubated & arrived in the PACU in stable condition.  I had discussed postoperative care with the patient in the holding area. I discussed operative findings, updated the patient's status, discussed probable steps to recovery, and gave postoperative recommendations to the Patient's husband and mother.  Recommendations were made.  Questions were answered.  They expressed understanding & appreciation. Instructions are written in the chart as well.  Ardeth SportsmanSteven C. Coreen Shippee, M.D., F.A.C.S. Gastrointestinal and Minimally Invasive Surgery [facility] [address] [place], [place] [id number] [phone] Main / Paging

## 2016-09-20 NOTE — Anesthesia Postprocedure Evaluation (Signed)
Anesthesia Post Note  Patient: Rebecca Tyler  Procedure(s) Performed: Procedure(s) (LRB): LAPAROSCOPIC CHOLECYSTECTOMY WITH INTRAOPERATIVE CHOLANGIOGRAM (N/A)  Patient location during evaluation: PACU Anesthesia Type: General Level of consciousness: awake and alert and oriented Pain management: pain level controlled Vital Signs Assessment: post-procedure vital signs reviewed and stable Respiratory status: nonlabored ventilation, spontaneous breathing, respiratory function stable and patient connected to nasal cannula oxygen Cardiovascular status: blood pressure returned to baseline and stable Postop Assessment: no signs of nausea or vomiting Anesthetic complications: no Comments: Patient had some low O2 sats in PACU, had some crackles posterior bases and some wheezing in anterior lung fields, which cleared with incentive spirometry, albuterol nebulizer and IV lasix. CXR done in PACU was unremarkable. Stable for discharge from PACU    Last Vitals:  Vitals:   09/20/16 1345 09/20/16 1400  BP: 138/88 123/71  Pulse: 95 96  Resp: 16 (!) 22  Temp:      Last Pain:  Vitals:   09/20/16 1345  TempSrc:   PainSc: 2                  Rayma Hegg A.

## 2016-09-20 NOTE — Transfer of Care (Signed)
Immediate Anesthesia Transfer of Care Note  Patient: Rebecca Tyler  Procedure(s) Performed: Procedure(s): LAPAROSCOPIC CHOLECYSTECTOMY WITH INTRAOPERATIVE CHOLANGIOGRAM (N/A)  Patient Location: PACU  Anesthesia Type:General  Level of Consciousness:  sedated, patient cooperative and responds to stimulation  Airway & Oxygen Therapy:Patient Spontanous Breathing and Patient connected to face mask oxgen  Post-op Assessment:  Report given to PACU RN and Post -op Vital signs reviewed and stable  Post vital signs:  Reviewed and stable  Last Vitals:  Vitals:   09/20/16 0050 09/20/16 1220  BP: 136/76 (!) (P) 151/76  Pulse: 83 (!) (P) 107  Resp: 18 (P) 19  Temp: 37.2 C (P) 36.9 C    Complications: No apparent anesthesia complications

## 2016-09-20 NOTE — Anesthesia Preprocedure Evaluation (Addendum)
Anesthesia Evaluation  Patient identified by MRN, date of birth, ID band Patient awake    Reviewed: Allergy & Precautions, Patient's Chart, lab work & pertinent test results  Airway Mallampati: II       Dental no notable dental hx. (+) Teeth Intact   Pulmonary neg pulmonary ROS,    Pulmonary exam normal breath sounds clear to auscultation       Cardiovascular negative cardio ROS Normal cardiovascular exam Rhythm:Regular Rate:Normal     Neuro/Psych PSYCHIATRIC DISORDERS Anxiety Depression negative neurological ROS     GI/Hepatic Neg liver ROS, Cholelithiasis with chronic cholecystitis   Endo/Other  Obesity  Renal/GU negative Renal ROS  negative genitourinary   Musculoskeletal negative musculoskeletal ROS (+)   Abdominal (+) + obese,   Peds  Hematology  (+) anemia ,   Anesthesia Other Findings Swelling over right parotid area  Reproductive/Obstetrics                            Lab Results  Component Value Date   WBC 8.1 09/19/2016   HGB 11.7 (L) 09/19/2016   HCT 35.3 (L) 09/19/2016   MCV 91.9 09/19/2016   PLT 266 09/19/2016     Chemistry      Component Value Date/Time   NA 136 09/20/2016 0453   K 3.6 09/20/2016 0453   CL 102 09/20/2016 0453   CO2 29 09/20/2016 0453   BUN 10 09/20/2016 0453   CREATININE 0.86 09/20/2016 0453      Component Value Date/Time   CALCIUM 8.9 09/20/2016 0453   ALKPHOS 76 09/19/2016 1823   AST 29 09/19/2016 1823   ALT 36 09/19/2016 1823   BILITOT 0.4 09/19/2016 1823     EKG: normal sinus rhythm, nonspecific ST and T waves changes, LAE.  Anesthesia Physical Anesthesia Plan  ASA: II  Anesthesia Plan: General   Post-op Pain Management:    Induction: Intravenous, Rapid sequence and Cricoid pressure planned  Airway Management Planned: Oral ETT  Additional Equipment:   Intra-op Plan:   Post-operative Plan: Extubation in OR  Informed  Consent: I have reviewed the patients History and Physical, chart, labs and discussed the procedure including the risks, benefits and alternatives for the proposed anesthesia with the patient or authorized representative who has indicated his/her understanding and acceptance.   Dental advisory given  Plan Discussed with: Anesthesiologist, CRNA and Surgeon  Anesthesia Plan Comments:         Anesthesia Quick Evaluation

## 2016-09-20 NOTE — Addendum Note (Signed)
Addendum  created 09/20/16 1549 by Wynonia SoursKaren L Gerold Sar, CRNA   Anesthesia Intra Flowsheets edited

## 2016-09-20 NOTE — Anesthesia Procedure Notes (Addendum)
Procedure Name: Intubation Date/Time: 09/20/2016 10:22 AM Performed by: Wynonia SoursWALKER, Joby Hershkowitz L Pre-anesthesia Checklist: Patient identified, Emergency Drugs available, Suction available, Patient being monitored and Timeout performed Patient Re-evaluated:Patient Re-evaluated prior to inductionOxygen Delivery Method: Circle system utilized Preoxygenation: Pre-oxygenation with 100% oxygen Intubation Type: IV induction, Cricoid Pressure applied and Rapid sequence Ventilation: Mask ventilation without difficulty Laryngoscope Size: Mac and 4 Grade View: Grade I Tube type: Oral Tube size: 7.0 mm Number of attempts: 1 Airway Equipment and Method: Stylet Placement Confirmation: ETT inserted through vocal cords under direct vision,  positive ETCO2,  CO2 detector and breath sounds checked- equal and bilateral Secured at: 21 cm Tube secured with: Tape Dental Injury: Teeth and Oropharynx as per pre-operative assessment

## 2016-09-21 DIAGNOSIS — K1120 Sialoadenitis, unspecified: Secondary | ICD-10-CM | POA: Diagnosis not present

## 2016-09-21 NOTE — Progress Notes (Signed)
Assessment unchanged. Pt and husband verbalized understanding of dc instructions through teach back including follow up care and when to call the doctor. Script given as provided by MD. Discharged via wc to front entrance accompanied by NT and husband.

## 2016-09-21 NOTE — Discharge Instructions (Signed)
LAPAROSCOPIC SURGERY: POST OP INSTRUCTIONS  ######################################################################  EAT Gradually transition to a high fiber diet with a fiber supplement over the next few weeks after discharge.  Start with a pureed / full liquid diet (see below)  WALK Walk an hour a day.  Control your pain to do that.    CONTROL PAIN Control pain so that you can walk, sleep, tolerate sneezing/coughing, go up/down stairs.  HAVE A BOWEL MOVEMENT DAILY Keep your bowels regular to avoid problems.  OK to try a laxative to override constipation.  OK to use an antidairrheal to slow down diarrhea.  Call if not better after 2 tries  CALL IF YOU HAVE PROBLEMS/CONCERNS Call if you are still struggling despite following these instructions. Call if you have concerns not answered by these instructions  ######################################################################    1. DIET: Follow a light bland diet the first 24 hours after arrival home, such as soup, liquids, crackers, etc.  Be sure to include lots of fluids daily.  Avoid fast food or heavy meals as your are more likely to get nauseated.  Eat a low fat the next few days after surgery.   2. Take your usually prescribed home medications unless otherwise directed. 3. PAIN CONTROL: a. Pain is best controlled by a usual combination of three different methods TOGETHER: i. Ice/Heat ii. Over the counter pain medication iii. Prescription pain medication b. Most patients will experience some swelling and bruising around the incisions.  Ice packs or heating pads (30-60 minutes up to 6 times a day) will help. Use ice for the first few days to help decrease swelling and bruising, then switch to heat to help relax tight/sore spots and speed recovery.  Some people prefer to use ice alone, heat alone, alternating between ice & heat.  Experiment to what works for you.  Swelling and bruising can take several weeks to resolve.   c. It is  helpful to take an over-the-counter pain medication regularly for the first few weeks.  Choose one of the following that works best for you: i. Naproxen (Aleve, etc)  Two 251m tabs twice a day ii. Ibuprofen (Advil, etc) Three 2053mtabs four times a day (every meal & bedtime) iii. Acetaminophen (Tylenol, etc) 500-65044mour times a day (every meal & bedtime) d. A  prescription for pain medication (such as oxycodone, hydrocodone, etc) should be given to you upon discharge.  Take your pain medication as prescribed.  i. If you are having problems/concerns with the prescription medicine (does not control pain, nausea, vomiting, rash, itching, etc), please call us [phone] see if we need to switch you to a different pain medicine that will work better for you and/or control your side effect better. ii. If you need a refill on your pain medication, please contact your pharmacy.  They will contact our office to request authorization. Prescriptions will not be filled after 5 pm or on week-ends. 4. Avoid getting constipated.  Between the surgery and the pain medications, it is common to experience some constipation.  Increasing fluid intake and taking a fiber supplement (such as Metamucil, Citrucel, FiberCon, MiraLax, etc) 1-2 times a day regularly will usually help prevent this problem from occurring.  A mild laxative (prune juice, Milk of Magnesia, MiraLax, etc) should be taken according to package directions if there are no bowel movements after 48 hours.   5. Watch out for diarrhea.  If you have many loose bowel movements, simplify your diet to bland foods & liquids for  a few days.  Stop any stool softeners and decrease your fiber supplement.  Switching to mild anti-diarrheal medications (Kayopectate, Pepto Bismol) can help.  If this worsens or does not improve, please call us. 6. Wash / shower every day.  You may shower over the dressings as they are waterproof.  Continue to shower over incision(s)  after the dressing is off. 7. Remove your waterproof bandages 5 days after surgery.  You may leave the incision open to air.  You may replace a dressing/Band-Aid to cover the incision for comfort if you wish.  8. ACTIVITIES as tolerated:   a. You may resume regular (light) daily activities beginning the next day--such as daily self-care, walking, climbing stairs--gradually increasing activities as tolerated.  If you can walk 30 minutes without difficulty, it is safe to try more intense activity such as jogging, treadmill, bicycling, low-impact aerobics, swimming, etc. b. Save the most intensive and strenuous activity for last such as sit-ups, heavy lifting, contact sports, etc  Refrain from any heavy lifting or straining until you are off narcotics for pain control.   c. DO NOT PUSH THROUGH PAIN.  Let pain be your guide: If it hurts to do something, don't do it.  Pain is your body warning you to avoid that activity for another week until the pain goes down. d. You may drive when you are no longer taking prescription pain medication, you can comfortably wear a seatbelt, and you can safely maneuver your car and apply brakes. e. Dennis Bast may have sexual intercourse when it is comfortable.  9. FOLLOW UP in our office a. Please call [facility] at [phone] to set up an appointment to see your surgeon in the office for a follow-up appointment approximately 2-3 weeks after your surgery. b. Make sure that you call for this appointment the day you arrive home to insure a convenient appointment time. 10. IF YOU HAVE DISABILITY OR FAMILY LEAVE FORMS, BRING THEM TO THE OFFICE FOR PROCESSING.  DO NOT GIVE THEM TO YOUR DOCTOR.   WHEN TO CALL us [phone]: 1. Poor pain control 2. Reactions / problems with new medications (rash/itching, nausea, etc)  3. Fever over 101.5 F (38.5 C) 4. Inability to urinate 5. Nausea and/or vomiting 6. Worsening swelling or bruising 7. Continued bleeding from incision. 8. Increased  pain, redness, or drainage from the incision   The clinic staff is available to answer your questions during regular business hours (8:30am-5pm).  Please dont hesitate to call and ask to speak to one of our nurses for clinical concerns.   If you have a medical emergency, go to the nearest emergency room or call 911.  A surgeon from [facility] is always on call at the [facility], [address], [place], [place], [place]  [id number] ? MAIN: [phone] ? TOLL FREE: [phone] ?  FAX [phone] [email or web address]     Cholecystitis Cholecystitis is inflammation of the gallbladder. It is often called a gallbladder attack. The gallbladder is a pear-shaped organ that lies beneath the liver on the right side of the body. The gallbladder stores bile, which is a fluid that helps the body to digest fats. If bile builds up in your gallbladder, your gallbladder becomes inflamed. This condition may occur suddenly (be acute). Repeat episodes of acute cholecystitis or prolonged episodes may lead to a long-term (chronic) condition. Cholecystitis is serious and it requires treatment. What are the causes? The most common cause of  this condition is gallstones. Gallstones can block the tube (duct) that carries bile out of your gallbladder. This causes bile to build up. Other causes of this condition include:  Damage to the gallbladder due to a decrease in blood flow.  Infections in the bile ducts.  Scars or kinks in the bile ducts.  Tumors in the liver, pancreas, or gallbladder. What increases the risk? This condition is more likely to develop in:  People who have sickle cell disease.  People who take birth control pills or use estrogen.  People who have alcoholic liver disease.  People who have liver cirrhosis.  People who have their nutrition delivered through a vein (parenteral nutrition).  People who do not eat or drink  (do fasting) for a long period of time.  People who are obese.  People who have rapid weight loss.  People who are pregnant.  People who have increased triglyceride levels.  People who have pancreatitis. What are the signs or symptoms? Symptoms of this condition include:  Abdominal pain, especially in the upper right area of the abdomen.  Abdominal tenderness or bloating.  Nausea.  Vomiting.  Fever.  Chills.  Yellowing of the skin and the whites of the eyes (jaundice). How is this diagnosed? This condition is diagnosed with a medical history and physical exam. You may also have other tests, including:  Imaging tests, such as:  An ultrasound of the gallbladder.  A CT scan of the abdomen.  A gallbladder nuclear scan (HIDA scan). This scan allows your health care provider to see the bile moving from your liver to your gallbladder and to your small intestine.  MRI.  Blood tests, such as:  A complete blood count, because the white blood cell count may be higher than normal.  Liver function tests, because some levels may be higher than normal with certain types of gallstones. How is this treated? Treatment may include:  Fasting for a certain amount of time.  IV fluids.  Medicine to treat pain or vomiting.  Antibiotic medicine.  Surgery to remove your gallbladder (cholecystectomy). This may happen immediately or at a later time. Follow these instructions at home: Home care will depend on your treatment. In general:  Take over-the-counter and prescription medicines only as told by your health care provider.  If you were prescribed an antibiotic medicine, take it as told by your health care provider. Do not stop taking the antibiotic even if you start to feel better.  Follow instructions from your health care provider about what to eat or drink. When you are allowed to eat, avoid eating or drinking anything that triggers your symptoms.  Keep all follow-up  visits as told by your health care provider. This is important. Contact a health care provider if:  Your pain is not controlled with medicine.  You have a fever. Get help right away if:  Your pain moves to another part of your abdomen or to your back.  You continue to have symptoms or you develop new symptoms even with treatment. This information is not intended to replace advice given to you by your health care provider. Make sure you discuss any questions you have with your health care provider. Document Released: 10/14/2005 Document Revised: 02/22/2016 Document Reviewed: 01/25/2015 Elsevier Interactive Patient Education  2017 [employer]    RIGHT PAROTITIS (SALIVA GLAND INFECTION)  For your RIGHT parotitis:  1)  Elevated HOB, 2) warm compress to RIGHT cheek 4 times daily, 3) drink plenty of fluids, 4) eat/drink  something to make your mouth water every 1-2 hrs (lemon juice, lime juice, vinegar, sweet tart candy, lemon head candy, etc.) 5) if the gland becomes swollen again and doesn't go down rapidly, squeeze the gland from your ear towards your nose every few hours to evacuate pus or secretions/thick saliva from the gland, 6) recheck with an ENT doctor 2 weeks, sooner as needed.    Parotitis Introduction Parotitis is irritation and swelling (inflammation) of one or both of your parotid glands. These glands produce saliva. They are found on each side of your face, below and in front of your earlobes. The saliva that they produce comes out of tiny openings (ducts) inside your cheeks. Parotitis may cause sudden swelling and pain (acute parotitis). It can also cause repeated episodes of swelling and pain or continued swelling that may or may not be painful (chronic parotitis). What are the causes? Causes of this condition include:  Bacterial infections.  Viral infections, such as mumps and HIV.  Blockage (obstruction) of saliva flow through the parotid glands. This can be from a  stone, scar tissue, or tumor.  Diseases that cause your body's defense system to attack salivary glands and cause inflammation (autoimmune diseases). What increases the risk? This condition is more likely to develop in:  People who are 5650 or older.  People who do not drink enough fluids (dehydration).  People who drink too much alcohol.  People who have a dry mouth.  People who have poor dental hygiene.  People who have diabetes.  People who have gout.  People who have a long-term illness.  People who have had X-ray treatments to the head and neck.  People who take certain medicines. What are the signs or symptoms? Symptoms of this condition depend on the cause. Symptoms may include:  Swelling under and in front of the ear. This may get worse after eating.  Redness of the skin over the parotid gland.  Pain and tenderness over the parotid gland. This may get worse after eating.  Fever or chills.  Pus coming from the ducts inside the mouth.  Dry mouth.  A bad taste in the mouth. How is this diagnosed? This condition is diagnosed with a medical history and physical exam. You may also have tests to find the cause of parotitis. These tests may include:  Doing blood tests to check for autoimmune disease or a viral infection.  Taking a sample of fluid from the parotid gland to test for infection.  Injecting the ducts of the gland with a dye before taking X-rays (sialogram).  Having other imaging studies of the gland, including X-rays, ultrasound, MRI, or CT scan.  Checking the opening of the gland for a stone or obstruction.  Placing a needle into the gland to remove tissue for a biopsy (fine needle aspiration). How is this treated? Treatment for this condition depends on the cause. Treatment may include:  Antibiotic medicine for a bacterial infection.  Drinking more fluids.  Removing a stone or obstruction.  Treating an underlying disease that is causing  parotitis.  Surgery to drain an infection, remove a growth, or remove the whole gland (parotidectomy). Treatment may not be needed if parotid swelling goes away with home care. Follow these instructions at home: Medicines  Take over-the-counter and prescription medicines only as told by your health care provider.  If you were prescribed an antibiotic, take it as told by your health care provider. Do not stop taking the antibiotic even if you start to  feel better. Managing pain and swelling  Apply warm compresses to the affected area as told by your health care provider.  Gently massage the parotid glands as told by your health care provider. General instructions  Drink enough fluid to keep your urine clear or pale yellow.  Try sucking on sour candy. This may help to make your mouth less dry and may stimulate the flow of saliva.  Keep your mouth clean and moist. Gargle with a salt-water mixture 3-4 times per day, or as needed. To make a salt-water mixture, completely dissolve -1 tsp of salt in 1 cup of warm water.  Maintain good oral health.  Brush your teeth at least two times per day.  Floss your teeth every day.  See your dentist regularly.  Do not use tobacco products, including cigarettes, chewing tobacco, or e-cigarettes. If you need help quitting, ask your health care provider.  Keep all follow-up visits as told by your health care provider. This is important. Contact a health care provider if:  You have a fever or chills.  You have new symptoms.  Your symptoms get worse.  Your symptoms do not improve with treatment. This information is not intended to replace advice given to you by your health care provider. Make sure you discuss any questions you have with your health care provider. Document Released: 04/05/2002 Document Revised: 03/21/2016 Document Reviewed: 03/09/2015  2017 [employer]vier

## 2016-09-21 NOTE — Discharge Summary (Signed)
Physician Discharge Summary  Patient ID: Rebecca Tyler MRN: [id number] DOB/AGE: January 24, 1966 50 y.o.  Admit date: 09/19/2016 Discharge date: 09/21/2016  Patient Care Team: Pcp Not In System as PCP - General Michael Boston, MD as Consulting Physician (General Surgery) Jodi Marble, MD as Consulting Physician (Otolaryngology)  Admission Diagnoses: Principal Problem:   Acute cholecystitis with chronic cholecystitis s/p lap cholecystectomy 09/20/2016 Active Problems:   Hypokalemia   Steatosis of liver   Obesity (BMI 30-39.9)   Chronic low back pain   Itching   Umbilical hernia s/p primary repair 09/20/2016   Parotiditis - right   Discharge Diagnoses:  Principal Problem:   Acute cholecystitis with chronic cholecystitis s/p lap cholecystectomy 09/20/2016 Active Problems:   Hypokalemia   Steatosis of liver   Obesity (BMI 30-39.9)   Chronic low back pain   Itching   Umbilical hernia s/p primary repair 09/20/2016   Parotiditis - right   POST-OPERATIVE DIAGNOSIS:   Cholelithiasis  SURGERY:  09/19/2016 - 09/20/2016  Procedure(s): LAPAROSCOPIC CHOLECYSTECTOMY WITH INTRAOPERATIVE CHOLANGIOGRAM  SURGEON:    Surgeon(s): Michael Boston, MD  Consults: ENT/Otolarygology:  Dr Jodi Marble, [facility]  Hospital Course:   [name]  Had severe episode of upper abdominal pain with some nausea and bloating suspicious for biliary colic.  Pain somewhat controlled on normal chronic pain medications but then worsened and would not go away.  She went to the [facility] emergency room.  She was transferred to [facility] for surgical consultation.  Because of suspicion of cholecystitis, she was admitted that night.  Placed on IV antibiotics.  Underwent laparoscopic cholecystectomy the following morning.  Had large stretched out gallbladder going down to bellybutton which most likely explained her referred pain to her  bellybutton.  Small umbilical hernia repaired primarily as well.  She did struggle with some right neck/cheek swelling suspicious for parotid inflammation.  Otolaryngology was consulted.  Reassurance and elevation recommended.  By the following day, swelling had markedly gone down and improved.   Postoperatively, the patient gradually mobilized and advanced to a solid diet.  Pain and other symptoms were treated aggressively.    By the time of discharge, the patient was walking well the hallways, eating food.  Mild bloating but no nausea or vomiting.  Pain was well-controlled on an oral medications.  Based on meeting discharge criteria and continuing to recover, I felt it was safe for the patient to be discharged from the hospital to further recover with close followup. Postoperative recommendations were discussed in detail.  They are written as well.   Significant Diagnostic Studies:  Results for orders placed or performed during the hospital encounter of 09/19/16 (from the past 72 hour(s))  CBC with Differential/Platelet     Status: Abnormal   Collection Time: 09/19/16  6:23 PM  Result Value Ref Range   WBC 8.1 4.0 - 10.5 K/uL   RBC 3.84 (L) 3.87 - 5.11 MIL/uL   Hemoglobin 11.7 (L) 12.0 - 15.0 g/dL   HCT 35.3 (L) 36.0 - 46.0 %   MCV 91.9 78.0 - 100.0 fL   MCH 30.5 26.0 - 34.0 pg   MCHC 33.1 30.0 - 36.0 g/dL   RDW 12.2 11.5 - 15.5 %   Platelets 266 150 - 400 K/uL   Neutrophils Relative % 69 %   Neutro Abs 5.6 1.7 - 7.7 K/uL   Lymphocytes Relative 23 %   Lymphs Abs 1.9 0.7 - 4.0 K/uL  Monocytes Relative 5 %   Monocytes Absolute 0.4 0.1 - 1.0 K/uL   Eosinophils Relative 3 %   Eosinophils Absolute 0.2 0.0 - 0.7 K/uL   Basophils Relative 0 %   Basophils Absolute 0.0 0.0 - 0.1 K/uL  Comprehensive metabolic panel     Status: Abnormal   Collection Time: 09/19/16  6:23 PM  Result Value Ref Range   Sodium 137 135 - 145 mmol/L   Potassium 2.9 (L) 3.5 - 5.1 mmol/L   Chloride 99 (L) 101 -  111 mmol/L   CO2 31 22 - 32 mmol/L   Glucose, Bld 120 (H) 65 - 99 mg/dL   BUN 14 6 - 20 mg/dL   Creatinine, Ser 1.04 (H) 0.44 - 1.00 mg/dL   Calcium 9.9 8.9 - 10.3 mg/dL   Total Protein 6.8 6.5 - 8.1 g/dL   Albumin 3.7 3.5 - 5.0 g/dL   AST 29 15 - 41 U/L   ALT 36 14 - 54 U/L   Alkaline Phosphatase 76 38 - 126 U/L   Total Bilirubin 0.4 0.3 - 1.2 mg/dL   GFR calc non Af Amer >60 >60 mL/min   GFR calc Af Amer >60 >60 mL/min    Comment: (NOTE) The eGFR has been calculated using the CKD EPI equation. This calculation has not been validated in all clinical situations. eGFR's persistently <60 mL/min signify possible Chronic Kidney Disease.    Anion gap 7 5 - 15  Lipase, blood     Status: None   Collection Time: 09/19/16  6:23 PM  Result Value Ref Range   Lipase 29 11 - 51 U/L  Urinalysis, Routine w reflex microscopic (not at [facility])     Status: Abnormal   Collection Time: 09/19/16  6:23 PM  Result Value Ref Range   Color, Urine YELLOW YELLOW   APPearance CLEAR CLEAR   Specific Gravity, Urine 1.008 1.005 - 1.030   pH 7.0 5.0 - 8.0   Glucose, UA NEGATIVE NEGATIVE mg/dL   Hgb urine dipstick NEGATIVE NEGATIVE   Bilirubin Urine MODERATE (A) NEGATIVE   Ketones, ur NEGATIVE NEGATIVE mg/dL   Protein, ur NEGATIVE NEGATIVE mg/dL   Nitrite NEGATIVE NEGATIVE   Leukocytes, UA NEGATIVE NEGATIVE    Comment: MICROSCOPIC NOT DONE ON URINES WITH NEGATIVE PROTEIN, BLOOD, LEUKOCYTES, NITRITE, OR GLUCOSE <1000 mg/dL.  Magnesium     Status: None   Collection Time: 09/19/16  6:23 PM  Result Value Ref Range   Magnesium 1.9 1.7 - 2.4 mg/dL  Surgical pcr screen     Status: None   Collection Time: 09/20/16  1:41 AM  Result Value Ref Range   MRSA, PCR NEGATIVE NEGATIVE   Staphylococcus aureus NEGATIVE NEGATIVE    Comment:        The Xpert SA Assay (FDA approved for NASAL specimens in patients over 37 years of age), is one component of a comprehensive surveillance program.  Test performance  has been validated by [facility] for patients greater than or equal to 34 year old. It is not intended to diagnose infection nor to guide or monitor treatment.   Basic metabolic panel     Status: Abnormal   Collection Time: 09/20/16  4:53 AM  Result Value Ref Range   Sodium 136 135 - 145 mmol/L   Potassium 3.6 3.5 - 5.1 mmol/L   Chloride 102 101 - 111 mmol/L   CO2 29 22 - 32 mmol/L   Glucose, Bld 101 (H) 65 - 99 mg/dL  BUN 10 6 - 20 mg/dL   Creatinine, Ser 0.86 0.44 - 1.00 mg/dL   Calcium 8.9 8.9 - 10.3 mg/dL   GFR calc non Af Amer >60 >60 mL/min   GFR calc Af Amer >60 >60 mL/min    Comment: (NOTE) The eGFR has been calculated using the CKD EPI equation. This calculation has not been validated in all clinical situations. eGFR's persistently <60 mL/min signify possible Chronic Kidney Disease.    Anion gap 5 5 - 15    Dg Cholangiogram Operative  Result Date: 09/20/2016 CLINICAL DATA:  Right upper quadrant pain. EXAM: INTRAOPERATIVE CHOLANGIOGRAM TECHNIQUE: Cholangiographic images from the C-arm fluoroscopic device were submitted for interpretation post-operatively. Please see the procedural report for the amount of contrast and the fluoroscopy time utilized. COMPARISON:  Ultrasound 09/19/2016 FINDINGS: Opacification of the intrahepatic and extrahepatic biliary system. There is no biliary dilatation. Contrast rapidly drains into the duodenum. There are no large filling defects or stones. IMPRESSION: Patent biliary system.  Normal intraoperative cholangiogram. Electronically Signed   By: Markus Daft M.D.   On: 09/20/2016 11:44   US Abdomen Complete  Result Date: 09/19/2016 CLINICAL DATA:  Periumbilical and epigastric pain for 1 day. EXAM: ABDOMEN ULTRASOUND COMPLETE COMPARISON:  None. FINDINGS: Gallbladder: Cholelithiasis with over distended gallbladder that is focally tender, thick walled (5 mm), and shows pericholecystic edema. Common bile duct: Diameter: 3 mm Liver: Diffusely  echogenic. No evidence of mass. Antegrade flow in the imaged hepatic and portal venous system. IVC: No abnormality visualized. Pancreas: Visualized portion unremarkable. Spleen: Size and appearance within normal limits. Right Kidney: Length: 11.5 cm. Echogenicity within normal limits. No mass or hydronephrosis visualized. Left Kidney: Length: 11 cm. Echogenicity within normal limits. No mass or hydronephrosis visualized. Abdominal aorta: No aneurysm visualized. IMPRESSION: 1. Positive for cholelithiasis and signs of acute cholecystitis. 2. Hepatic steatosis. Electronically Signed   By: Monte Fantasia M.D.   On: 09/19/2016 19:36   Dg Chest Port 1 View  Result Date: 09/20/2016 CLINICAL DATA:  Postop, shortness of breath, hypoxia EXAM: PORTABLE CHEST 1 VIEW COMPARISON:  None. FINDINGS: Cardiomediastinal silhouette is unremarkable. No infiltrate or pleural effusion. No pulmonary edema. Old left rib fracture deformity. IMPRESSION: No active disease. Electronically Signed   By: Lahoma Crocker M.D.   On: 09/20/2016 14:03    Discharge Exam: Blood pressure 112/64, pulse 79, temperature 98.3 F (36.8 C), temperature source Oral, resp. rate 20, height 5' 6" (1.676 m), weight 108.9 kg (240 lb), SpO2 93 %.  General: Pt awake/alert/oriented x4 in no major acute distress Eyes: PERRL, normal EOM. Sclera nonicteric Neuro: CN II-XII intact w/o focal sensory/motor deficits. Lymph: No head/neck/groin lymphadenopathy Psych:  No delerium/psychosis/paranoia HENT: Normocephalic, Mucus membranes moist.  No thrush Neck: Supple, No tracheal deviation Chest: No pain.  Good respiratory excursion. CV:  Pulses intact.  Regular rhythm MS: Normal AROM mjr joints.  No obvious deformity Abdomen: Soft, Nondistended.  Well-healed incision at bellybutton with normal healing ridge.  Mild sensitivity/tenderness there.  No Murphy sign.  No more upper abdominal pain. No incarcerated hernias. Ext:  SCDs BLE.  No significant edema.  No  cyanosis Skin: No petechiae / purpura  Discharged Condition: good   Past Medical History:  Diagnosis Date  . Anxiety     Past Surgical History:  Procedure Laterality Date  . LIPOSUCTION      Social History   Social History  . Marital status: Married    Spouse name: N/A  . Number of children: N/A  .  Years of education: N/A   Occupational History  . Not on file.   Social History Main Topics  . Smoking status: Never Smoker  . Smokeless tobacco: Never Used  . Alcohol use No  . Drug use: No  . Sexual activity: Not on file   Other Topics Concern  . Not on file   Social History Narrative  . No narrative on file    History reviewed. No pertinent family history.  Current Facility-Administered Medications  Medication Dose Route Frequency Provider Last Rate Last Dose  . 0.9 %  sodium chloride infusion  250 mL Intravenous PRN Michael Boston, MD      . acetaminophen (TYLENOL) suppository 650 mg  650 mg Rectal Q6H PRN Michael Boston, MD      . acetaminophen (TYLENOL) tablet 1,000 mg  1,000 mg Oral TID Michael Boston, MD   1,000 mg at 09/20/16 2116  . alum & mag hydroxide-simeth (MAALOX/MYLANTA) 200-200-20 MG/5ML suspension 30 mL  30 mL Oral Q6H PRN Michael Boston, MD      . atenolol (TENORMIN) tablet 50 mg  50 mg Oral Daily Michael Boston, MD   50 mg at 09/20/16 1644   And  . chlorthalidone (HYGROTON) tablet 50 mg  50 mg Oral Daily Michael Boston, MD   50 mg at 09/20/16 1644  . bisacodyl (DULCOLAX) suppository 10 mg  10 mg Rectal Q12H PRN Michael Boston, MD      . buPROPion (WELLBUTRIN XL) 24 hr tablet 300 mg  300 mg Oral Daily Michael Boston, MD   300 mg at 09/20/16 1644  . cholecalciferol (VITAMIN D) tablet 1,000 Units  1,000 Units Oral Daily Michael Boston, MD   1,000 Units at 09/20/16 1644  . citalopram (CELEXA) tablet 40 mg  40 mg Oral Daily Michael Boston, MD   40 mg at 09/20/16 1644  . diphenhydrAMINE (BENADRYL) 12.5 MG/5ML elixir 12.5 mg  12.5 mg Oral Q6H PRN Michael Boston, MD   12.5 mg  at 09/20/16 1711   Or  . diphenhydrAMINE (BENADRYL) injection 12.5 mg  12.5 mg Intravenous Q6H PRN Michael Boston, MD   12.5 mg at 09/20/16 0913  . enoxaparin (LOVENOX) injection 40 mg  40 mg Subcutaneous QHS Michael Boston, MD   40 mg at 09/20/16 2116  . ertapenem (INVANZ) 1 g in sodium chloride 0.9 % 50 mL IVPB  1 g Intravenous Q24H Michael Boston, MD   1 g at 09/20/16 1025  . etodolac (LODINE) capsule 500 mg  500 mg Oral BID Michael Boston, MD   500 mg at 09/20/16 2116  . fentaNYL (SUBLIMAZE) injection 25-50 mcg  25-50 mcg Intravenous Q1H PRN Michael Boston, MD      . hydrALAZINE (APRESOLINE) injection 10 mg  10 mg Intravenous Q2H PRN Michael Boston, MD      . ibuprofen (ADVIL,MOTRIN) tablet 400-800 mg  400-800 mg Oral Q6H PRN Michael Boston, MD      . lactated ringers bolus 1,000 mL  1,000 mL Intravenous Q8H PRN Michael Boston, MD      . lactated ringers bolus 1,000 mL  1,000 mL Intravenous Q8H PRN Michael Boston, MD      . lactated ringers infusion   Intravenous Continuous Michael Boston, MD 75 mL/hr at 09/20/16 2324 1,000 mL at 09/20/16 2324  . lidocaine (XYLOCAINE) 2 % jelly 1 application  1 application Topical Once PRN Michael Boston, MD      . lidocaine (XYLOCAINE) 4 % external solution 0-50 mL  0-50 mL Topical Once  PRN Michael Boston, MD      . lidocaine-EPINEPHrine (XYLOCAINE W/EPI) 1 %-1:100000 (with pres) injection 0-30 mL  0-30 mL Intradermal Once PRN Michael Boston, MD      . lip balm (CARMEX) ointment 1 application  1 application Topical BID Michael Boston, MD   1 application at 90/30/09 2200  . magic mouthwash  15 mL Oral QID PRN Michael Boston, MD      . menthol-cetylpyridinium (CEPACOL) lozenge 3 mg  1 lozenge Oral PRN Michael Boston, MD      . methocarbamol (ROBAXIN) 1,000 mg in dextrose 5 % 50 mL IVPB  1,000 mg Intravenous Q6H PRN Michael Boston, MD      . methocarbamol (ROBAXIN) tablet 1,000 mg  1,000 mg Oral Q6H PRN Michael Boston, MD      . metoCLOPramide (REGLAN) injection 5-10 mg  5-10 mg Intravenous Q6H  PRN Michael Boston, MD      . metoprolol (LOPRESSOR) injection 5 mg  5 mg Intravenous Q6H PRN Michael Boston, MD      . multivitamin with minerals tablet 1 tablet  1 tablet Oral Daily Michael Boston, MD   1 tablet at 09/20/16 1644  . neomycin-bacitracin-polymyxin (NEOSPORIN) ointment   Topical Once PRN Michael Boston, MD      . ondansetron [facility]) 8 mg in sodium chloride 0.9 % 50 mL IVPB  8 mg Intravenous Q6H PRN Michael Boston, MD      . ondansetron (ZOFRAN-ODT) disintegrating tablet 4 mg  4 mg Oral Q6H PRN Michael Boston, MD       Or  . ondansetron [facility]) injection 4 mg  4 mg Intravenous Q6H PRN Michael Boston, MD      . oxyCODONE (Oxy IR/ROXICODONE) immediate release tablet 5-10 mg  5-10 mg Oral Q4H PRN Michael Boston, MD   10 mg at 09/21/16 2330  . oxymetazoline (AFRIN) 0.05 % nasal spray 1 spray  1 spray Each Nare Once PRN Michael Boston, MD      . phenol (CHLORASEPTIC) mouth spray 2 spray  2 spray Mouth/Throat PRN Michael Boston, MD      . polyethylene glycol (MIRALAX / GLYCOLAX) packet 17 g  17 g Oral BID Michael Boston, MD   17 g at 09/20/16 2116  . prochlorperazine (COMPAZINE) injection 5-10 mg  5-10 mg Intravenous Q4H PRN Michael Boston, MD      . saccharomyces boulardii (FLORASTOR) capsule 250 mg  250 mg Oral BID Michael Boston, MD   250 mg at 09/20/16 2116  . silver nitrate applicators applicator 1 Stick  1 Stick Topical Once PRN Michael Boston, MD      . simethicone [facility]) chewable tablet 40 mg  40 mg Oral Q6H PRN Michael Boston, MD      . sodium chloride flush (NS) 0.9 % injection 3 mL  3 mL Intravenous Q12H Michael Boston, MD      . sodium chloride flush (NS) 0.9 % injection 3 mL  3 mL Intravenous PRN Michael Boston, MD      . traMADol Veatrice Bourbon) tablet 50-100 mg  50-100 mg Oral Q6H PRN Michael Boston, MD      . vancomycin (VANCOCIN) IVPB 750 mg/150 ml premix  750 mg Intravenous Q12H Nikola Glogovac, RPH   750 mg at 09/21/16 0357  . vitamin B-12 (CYANOCOBALAMIN) tablet 1,000 mcg  1,000 mcg Oral Daily Michael Boston, MD   1,000 mcg at 09/20/16 1644     Allergies  Allergen Reactions  . Sulfa Antibiotics Rash  Disposition: Final discharge disposition not confirmed  Discharge Instructions    Call MD for:    Complete by:  As directed    Temperature > 101.27F   Call MD for:    Complete by:  As directed    Temperature > 101.27F   Call MD for:  extreme fatigue    Complete by:  As directed    Call MD for:  extreme fatigue    Complete by:  As directed    Call MD for:  hives    Complete by:  As directed    Call MD for:  hives    Complete by:  As directed    Call MD for:  persistant nausea and vomiting    Complete by:  As directed    Call MD for:  persistant nausea and vomiting    Complete by:  As directed    Call MD for:  redness, tenderness, or signs of infection (pain, swelling, redness, odor or green/yellow discharge around incision site)    Complete by:  As directed    Call MD for:  redness, tenderness, or signs of infection (pain, swelling, redness, odor or green/yellow discharge around incision site)    Complete by:  As directed    Call MD for:  severe uncontrolled pain    Complete by:  As directed    Call MD for:  severe uncontrolled pain    Complete by:  As directed    Diet - low sodium heart healthy    Complete by:  As directed    Start with bland, low residue diet for a few days, then advance to a heart healthy (low fat, high fiber) diet.  If you feel nauseated or constipated, simplify to a liquid only diet for 48 hours until you are feeling better (no more nausea, farting/passing gas, having a bowel movement, etc...).  If you cannot tolerate even drinking liquids, or feeling worse, let your surgeon know or go to the Emergency Department for help.   Diet - low sodium heart healthy    Complete by:  As directed    Start with bland, low residue diet for a few days, then advance to a heart healthy (low fat, high fiber) diet.  If you feel nauseated or constipated, simplify to a liquid  only diet for 48 hours until you are feeling better (no more nausea, farting/passing gas, having a bowel movement, etc...).  If you cannot tolerate even drinking liquids, or feeling worse, let your surgeon know or go to the Emergency Department for help.   Discharge instructions    Complete by:  As directed    Please see discharge instruction sheets.   Also refer to any handouts/printouts that may have been given from the [facility] surgery office (if you visited [place] there before surgery) Please call our office if you have any questions or concerns [phone]   Discharge instructions    Complete by:  As directed    Please see discharge instruction sheets.   Also refer to any handouts/printouts that may have been given from the [facility] surgery office (if you visited [place] there before surgery) Please call our office if you have any questions or concerns [phone]   Discharge wound care:    Complete by:  As directed    You have closed incisions: Shower and bathe over these incisions with soap and water every day.  It is OK to wash over the dressings: they are waterproof. Remove all surgical  dressings on postoperative day #3, 11/27 Monday.  You do not need to replace dressings over the closed incisions unless you feel more comfortable with a Band-Aid covering it.   If you have an open wound: That requires packing, so please see wound care instructions.   In general, remove all dressings, wash wound with soap and water and then replace with saline moistened gauze.  Do the dressing change at least every day.    Please call our office [phone] if you have further questions.   Discharge wound care:    Complete by:  As directed    If you have closed incisions: Shower and bathe over these incisions with soap and water every day.  It is OK to wash over the dressings: they are waterproof. Remove all surgical dressings on postoperative day #3.  You do not need to replace dressings over the closed  incisions unless you feel more comfortable with a Band-Aid covering it.   If you have an open wound: That requires packing, so please see wound care instructions.   In general, remove all dressings, wash wound with soap and water and then replace with saline moistened gauze.  Do the dressing change at least every day.    Please call our office [phone] if you have further questions.   Driving Restrictions    Complete by:  As directed    No driving until off narcotics and can safely swerve away without pain during an emergency   Driving Restrictions    Complete by:  As directed    No driving until off narcotics and can safely swerve away without pain during an emergency   Increase activity slowly    Complete by:  As directed    Increase activity slowly    Complete by:  As directed    Lifting restrictions    Complete by:  As directed    Avoid heavy lifting initially, <20 pounds at first.   Do not push through pain.   You have no specific weight limit: If it hurts to do, DON'T DO IT.    If you feel no pain, you are not injuring anything.  Pain will protect you from injury.   Coughing and sneezing are far more stressful to your incision than any lifting.   Avoid resuming heavy lifting (>50 pounds) or other intense activity until off all narcotic pain medications.   When want to exercise more, give yourself 2 weeks to gradually get back to full intense exercise/activity.   Lifting restrictions    Complete by:  As directed    Avoid heavy lifting initially, <20 pounds at first.   Do not push through pain.   You have no specific weight limit: If it hurts to do, DON'T DO IT.    If you feel no pain, you are not injuring anything.  Pain will protect you from injury.   Coughing and sneezing are far more stressful to your incision than any lifting.   Avoid resuming heavy lifting (>50 pounds) or other intense activity until off all narcotic pain medications.   When want to exercise more, give  yourself 2 weeks to gradually get back to full intense exercise/activity.   May shower / Bathe    Complete by:  As directed    [place].  It is fine for dressings or wounds to be washed/rinsed.  Use gentle soap & water.  This will help the incisions and/or wounds get clean & minimize infection.  May shower / Bathe    Complete by:  As directed    [place].  It is fine for dressings or wounds to be washed/rinsed.  Use gentle soap & water.  This will help the incisions and/or wounds get clean & minimize infection.   May walk up steps    Complete by:  As directed    May walk up steps    Complete by:  As directed    Sexual Activity Restrictions    Complete by:  As directed    Sexual activity as tolerated.  Do not push through pain.  Pain will protect you from injury.   Sexual Activity Restrictions    Complete by:  As directed    Sexual activity as tolerated.  Do not push through pain.  Pain will protect you from injury.   Walk with assistance    Complete by:  As directed    Walk over an hour a day.  May use a walker/cane/companion to help with balance and stamina.   Walk with assistance    Complete by:  As directed    Walk over an hour a day.  May use a walker/cane/companion to help with balance and stamina.       Medication List    TAKE these medications   atenolol-chlorthalidone 50-25 MG tablet Commonly known as:  TENORETIC Take 1 tablet by mouth daily.   buPROPion 150 MG 12 hr tablet Commonly known as:  WELLBUTRIN SR Take 150 mg by mouth daily.   buPROPion 300 MG 24 hr tablet Commonly known as:  WELLBUTRIN XL Take 300 mg by mouth daily.   cholecalciferol 1000 units tablet Commonly known as:  VITAMIN D Take 1,000 Units by mouth daily.   citalopram 20 MG tablet Commonly known as:  CELEXA Take 40 mg by mouth daily.   etodolac 500 MG tablet Commonly known as:  LODINE Take 500 mg by mouth 2 (two) times daily.   GLUCOSAMINE HCL PO Take 1 tablet by mouth  daily.   multivitamin with minerals Tabs tablet Take 1 tablet by mouth daily.   naproxen 500 MG tablet Commonly known as:  NAPROSYN Take 1 tablet (500 mg total) by mouth every 12 (twelve) hours as needed for mild pain or moderate pain.   oxyCODONE 5 MG immediate release tablet Commonly known as:  Oxy IR/ROXICODONE Take 1-2 tablets (5-10 mg total) by mouth every 4 (four) hours as needed for moderate pain, severe pain or breakthrough pain.   VITAMIN A PO Take 1 tablet by mouth daily.   vitamin B-12 1000 MCG tablet Commonly known as:  CYANOCOBALAMIN Take 1,000 mcg by mouth daily.      Follow-up Information    GROSS,STEVEN C., MD. Schedule an appointment as soon as possible for a visit in 3 week(s).   Specialty:  General Surgery Why:  To follow up after your operation, To follow up after your hospital stay Contact information: [address] [place] [address] [phone]            Signed: Morton Peters, M.D., F.A.C.S. Gastrointestinal [facility]ally [facility] [address], [place], [place] [address] [phone] Main / Paging   09/21/2016, 8:54 AM

## 2016-09-23 ENCOUNTER — Encounter (HOSPITAL_COMMUNITY): Payer: Self-pay | Admitting: Surgery

## 2017-02-15 IMAGING — US US ABDOMEN COMPLETE
1 series · 14 of 25 positions shown · non-contrast
Comparison: None.

CLINICAL DATA: Periumbilical and epigastric pain for 1 day.

EXAM:
ABDOMEN ULTRASOUND COMPLETE

[Series 1: us abdomen complete · 0.18mm/px · 14 of 76 slices shown]
[im 1/76]
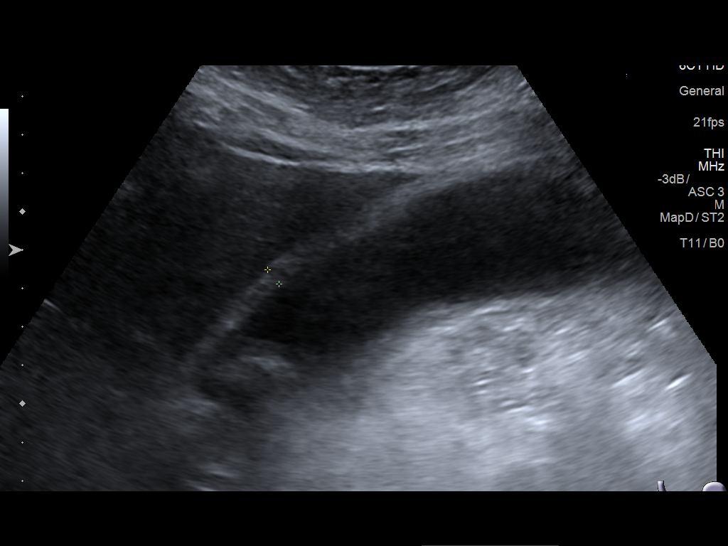
[im 7/76]
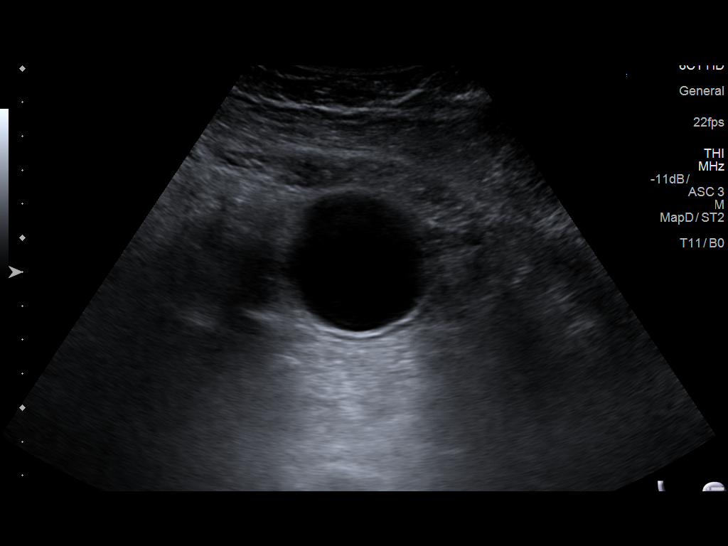
[im 13/76]
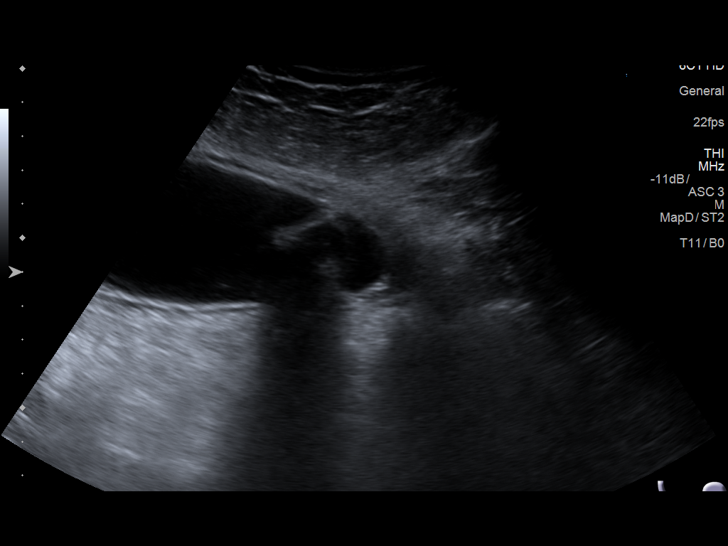
[im 19/76]
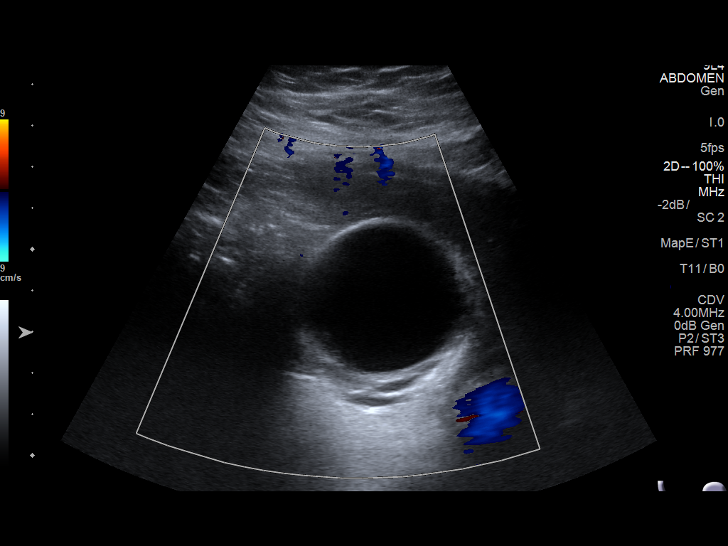
[im 26/76]
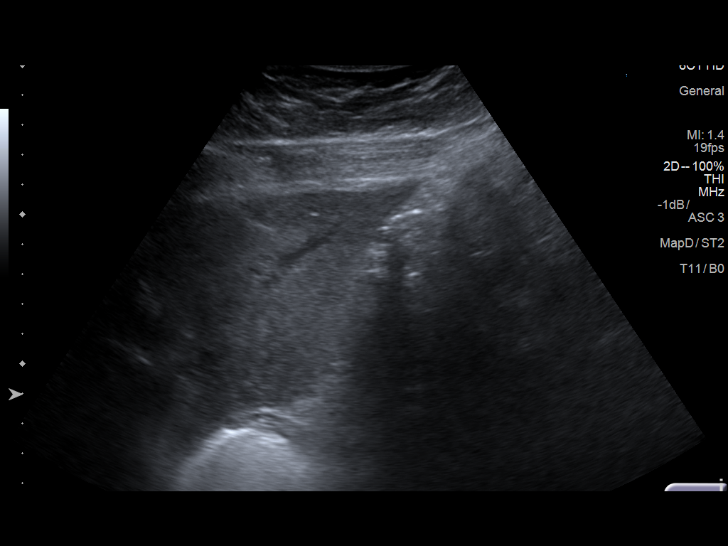
[im 29/76]
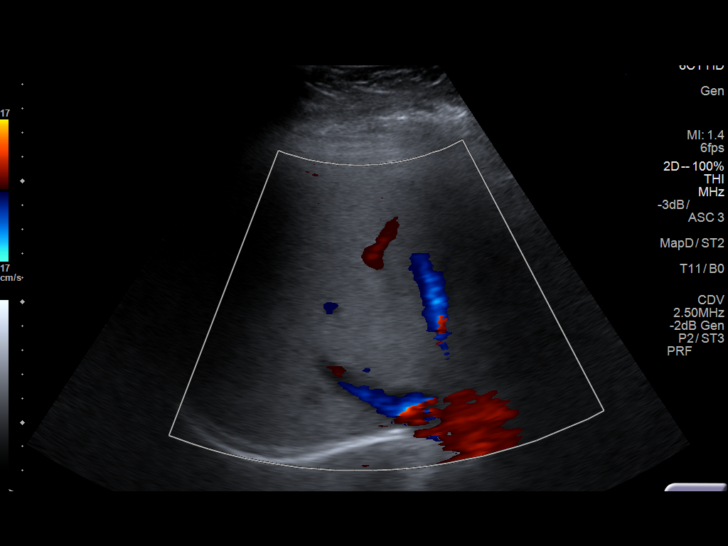
[im 35/76]
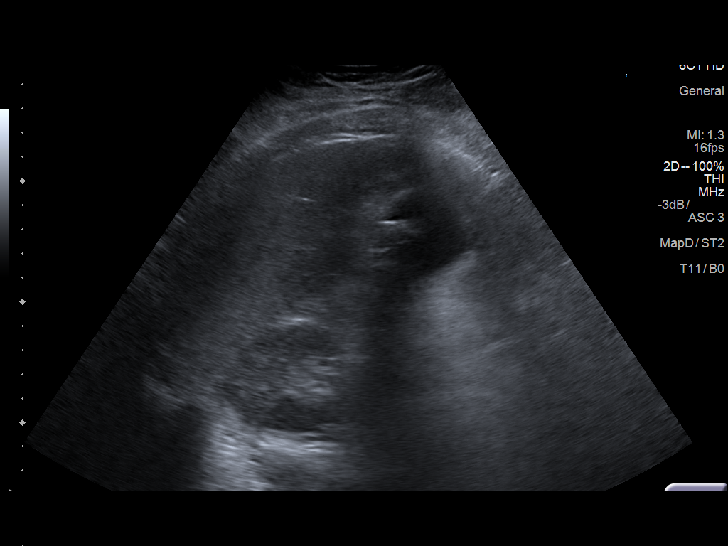
[im 41/76]
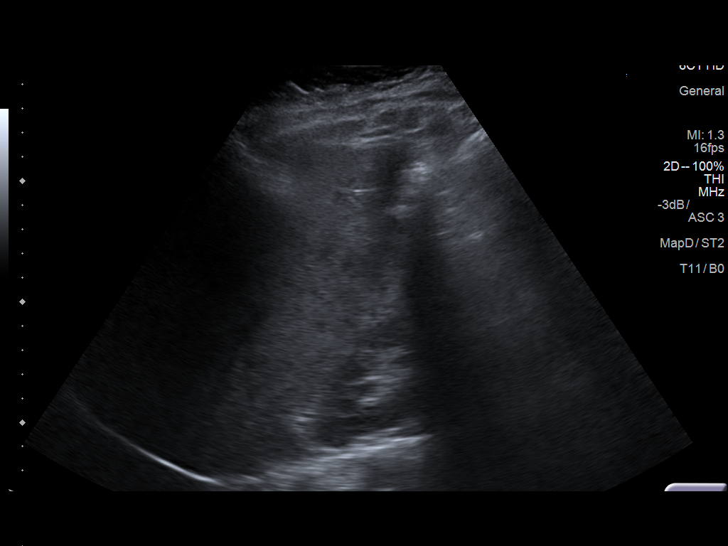
[im 47/76]
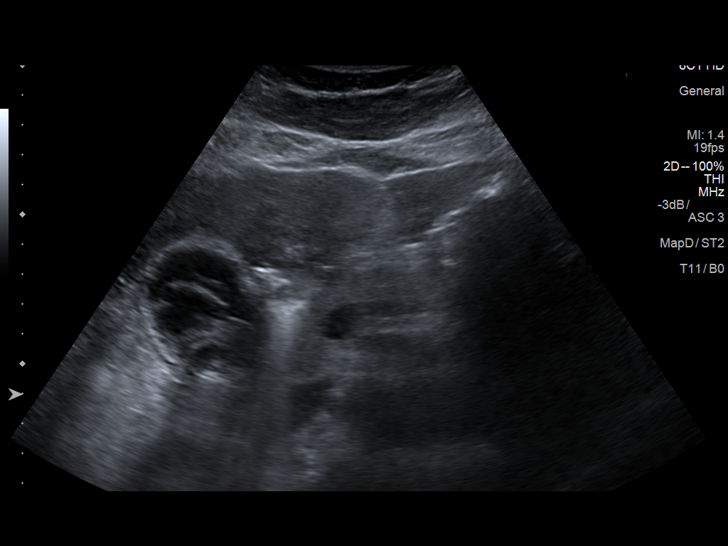
[im 51/76]
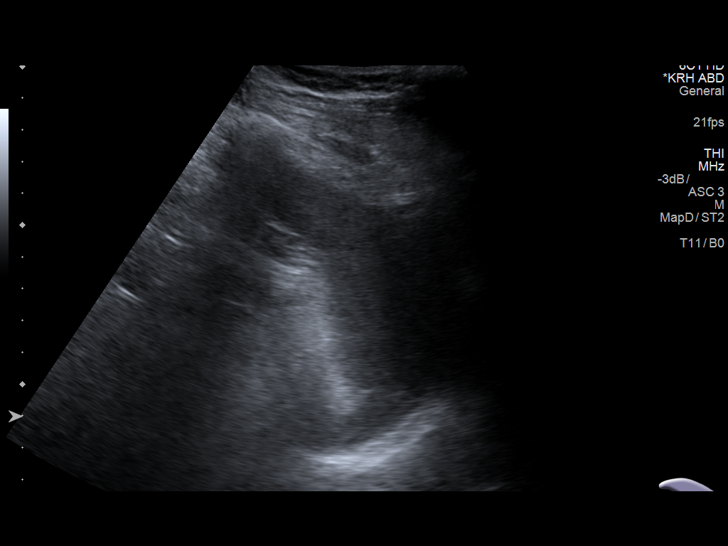
[im 57/76]
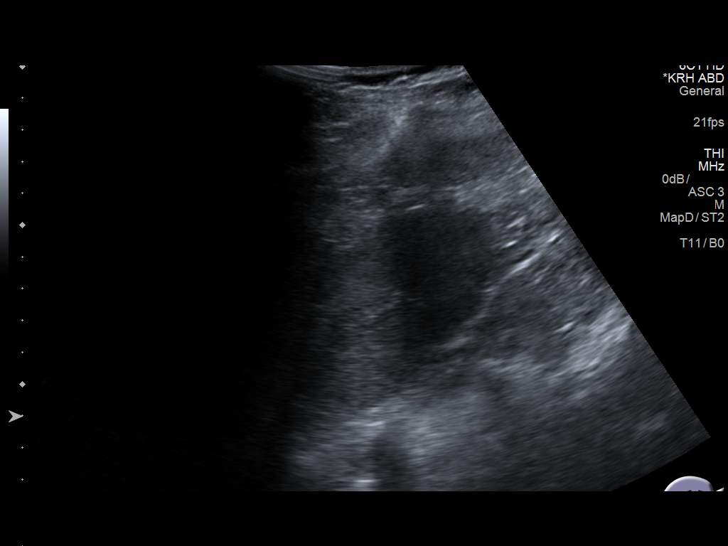
[im 63/76]
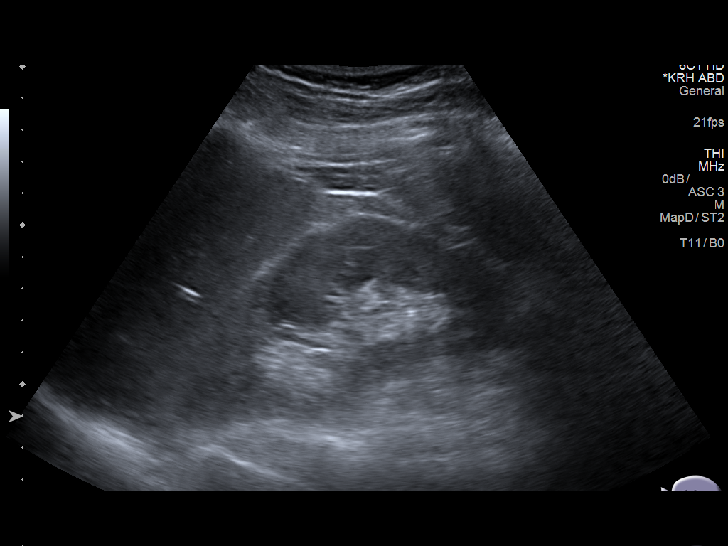
[im 69/76]
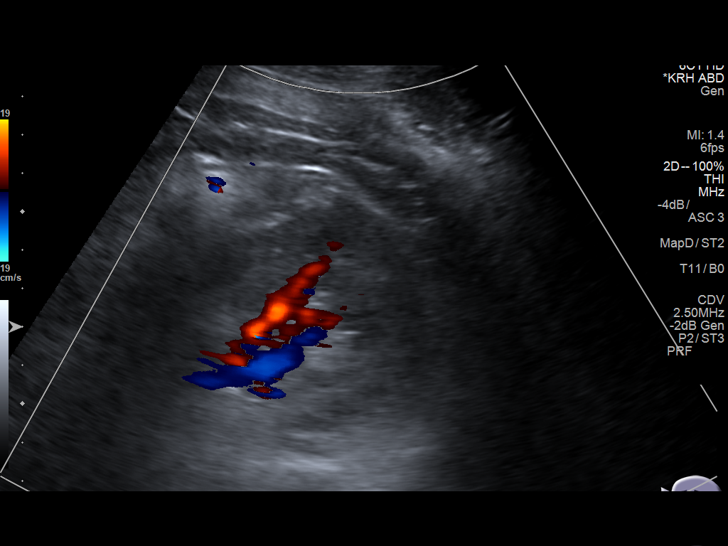
[im 76/76]
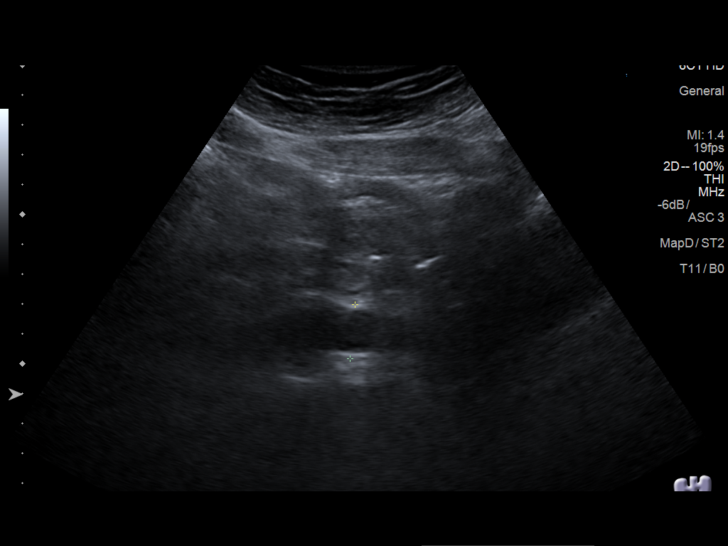

[14 of 25 positions shown; findings below may reference images not displayed]

FINDINGS: Gallbladder: Cholelithiasis with over distended gallbladder that is
focally tender, thick walled (5 mm), and shows pericholecystic
edema.

Common bile duct: Diameter: 3 mm

Liver: Diffusely echogenic. No evidence of mass. Antegrade flow in
the imaged hepatic and portal venous system.

IVC: No abnormality visualized.

Pancreas: Visualized portion unremarkable.

Spleen: Size and appearance within normal limits.

Right Kidney: Length: 11.5 cm. Echogenicity within normal limits. No
mass or hydronephrosis visualized.

Left Kidney: Length: 11 cm. Echogenicity within normal limits. No
mass or hydronephrosis visualized.

Abdominal aorta: No aneurysm visualized.
IMPRESSION: 1. Positive for cholelithiasis and signs of acute cholecystitis.
2. Hepatic steatosis.

## 2017-06-14 IMAGING — DX DG CHEST 1V PORT
1 series · 1 of 1 positions shown · non-contrast
Comparison: None.

CLINICAL DATA: Postop, shortness of breath, hypoxia

EXAM:
PORTABLE CHEST 1 VIEW

[chest ap]
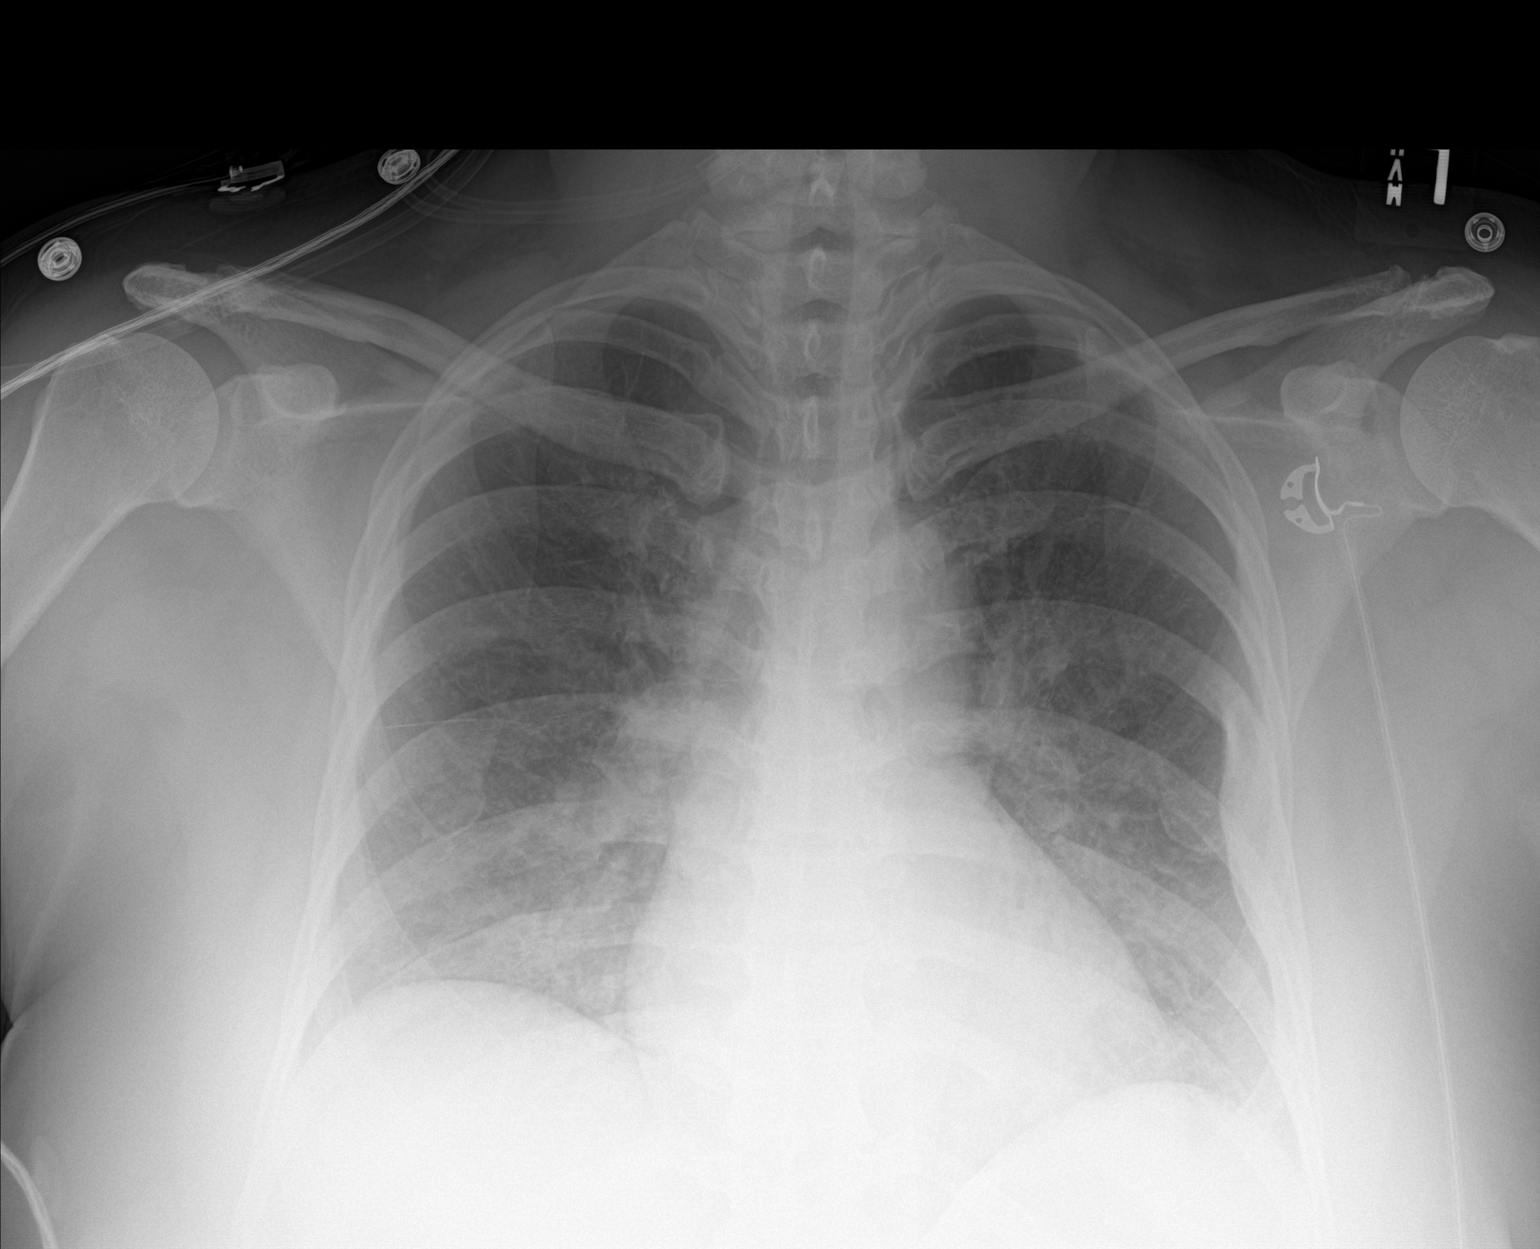

[1 of 1 positions shown; findings below may reference images not displayed]

FINDINGS: Cardiomediastinal silhouette is unremarkable. No infiltrate or
pleural effusion. No pulmonary edema. Old left rib fracture
deformity.
IMPRESSION: No active disease.
